# Patient Record
Sex: Female | Born: 1945 | Race: White | Hispanic: Yes | Marital: Married | Smoking: Never smoker
Health system: Southern US, Community
[De-identification: ages and names within clinical notes are randomized; demographics above are authoritative.]

## PROBLEM LIST (undated history)

## (undated) DIAGNOSIS — R079 Chest pain, unspecified: Secondary | ICD-10-CM

## (undated) DIAGNOSIS — R42 Dizziness and giddiness: Secondary | ICD-10-CM

## (undated) DIAGNOSIS — E2839 Other primary ovarian failure: Secondary | ICD-10-CM

## (undated) DIAGNOSIS — E119 Type 2 diabetes mellitus without complications: Secondary | ICD-10-CM

## (undated) DIAGNOSIS — I4891 Unspecified atrial fibrillation: Secondary | ICD-10-CM

## (undated) DIAGNOSIS — I10 Essential (primary) hypertension: Secondary | ICD-10-CM

## (undated) DIAGNOSIS — E785 Hyperlipidemia, unspecified: Secondary | ICD-10-CM

## (undated) DIAGNOSIS — I728 Aneurysm of other specified arteries: Secondary | ICD-10-CM

## (undated) HISTORY — DX: Unspecified atrial fibrillation: I48.91

## (undated) HISTORY — DX: Chest pain, unspecified: R07.9

## (undated) HISTORY — DX: Hyperlipidemia, unspecified: E78.5

## (undated) HISTORY — DX: Dizziness and giddiness: R42

## (undated) HISTORY — DX: Essential (primary) hypertension: I10

## (undated) HISTORY — DX: Other primary ovarian failure: E28.39

## (undated) HISTORY — DX: Aneurysm of other specified arteries: I72.8

## (undated) HISTORY — DX: Type 2 diabetes mellitus without complications: E11.9

---

## 1998-07-24 ENCOUNTER — Emergency Department (HOSPITAL_COMMUNITY): Admission: EM | Admit: 1998-07-24 | Discharge: 1998-07-24 | Payer: Self-pay | Admitting: Emergency Medicine

## 1998-07-24 ENCOUNTER — Encounter: Payer: Self-pay | Admitting: Emergency Medicine

## 1998-08-08 ENCOUNTER — Emergency Department (HOSPITAL_COMMUNITY): Admission: EM | Admit: 1998-08-08 | Discharge: 1998-08-08 | Payer: Self-pay | Admitting: *Deleted

## 1998-08-11 ENCOUNTER — Emergency Department (HOSPITAL_COMMUNITY): Admission: EM | Admit: 1998-08-11 | Discharge: 1998-08-11 | Payer: Self-pay | Admitting: Emergency Medicine

## 1998-08-11 ENCOUNTER — Encounter: Payer: Self-pay | Admitting: Emergency Medicine

## 1998-08-23 ENCOUNTER — Ambulatory Visit (HOSPITAL_COMMUNITY): Admission: RE | Admit: 1998-08-23 | Discharge: 1998-08-23 | Payer: Self-pay | Admitting: *Deleted

## 1999-02-13 ENCOUNTER — Emergency Department (HOSPITAL_COMMUNITY): Admission: EM | Admit: 1999-02-13 | Discharge: 1999-02-13 | Payer: Self-pay | Admitting: *Deleted

## 1999-09-26 ENCOUNTER — Emergency Department (HOSPITAL_COMMUNITY): Admission: EM | Admit: 1999-09-26 | Discharge: 1999-09-26 | Payer: Self-pay | Admitting: Emergency Medicine

## 1999-12-22 ENCOUNTER — Emergency Department (HOSPITAL_COMMUNITY): Admission: EM | Admit: 1999-12-22 | Discharge: 1999-12-22 | Payer: Self-pay

## 1999-12-22 ENCOUNTER — Encounter: Payer: Self-pay | Admitting: Emergency Medicine

## 2000-04-28 ENCOUNTER — Emergency Department (HOSPITAL_COMMUNITY): Admission: EM | Admit: 2000-04-28 | Discharge: 2000-04-28 | Payer: Self-pay | Admitting: Emergency Medicine

## 2000-07-13 ENCOUNTER — Ambulatory Visit (HOSPITAL_COMMUNITY): Admission: RE | Admit: 2000-07-13 | Discharge: 2000-07-13 | Payer: Self-pay | Admitting: Family Medicine

## 2000-07-15 ENCOUNTER — Encounter: Admission: RE | Admit: 2000-07-15 | Discharge: 2000-07-15 | Payer: Self-pay | Admitting: *Deleted

## 2002-07-21 ENCOUNTER — Encounter: Payer: Self-pay | Admitting: Internal Medicine

## 2002-07-21 ENCOUNTER — Ambulatory Visit (HOSPITAL_COMMUNITY): Admission: RE | Admit: 2002-07-21 | Discharge: 2002-07-21 | Payer: Self-pay | Admitting: Internal Medicine

## 2003-03-02 ENCOUNTER — Ambulatory Visit (HOSPITAL_COMMUNITY): Admission: RE | Admit: 2003-03-02 | Discharge: 2003-03-02 | Payer: Self-pay | Admitting: Internal Medicine

## 2003-07-04 ENCOUNTER — Emergency Department (HOSPITAL_COMMUNITY): Admission: EM | Admit: 2003-07-04 | Discharge: 2003-07-04 | Payer: Self-pay | Admitting: Emergency Medicine

## 2004-06-25 ENCOUNTER — Emergency Department (HOSPITAL_COMMUNITY): Admission: EM | Admit: 2004-06-25 | Discharge: 2004-06-25 | Payer: Self-pay | Admitting: Family Medicine

## 2004-10-13 ENCOUNTER — Emergency Department (HOSPITAL_COMMUNITY): Admission: EM | Admit: 2004-10-13 | Discharge: 2004-10-13 | Payer: Self-pay | Admitting: Emergency Medicine

## 2004-10-20 ENCOUNTER — Ambulatory Visit (HOSPITAL_COMMUNITY): Admission: RE | Admit: 2004-10-20 | Discharge: 2004-10-20 | Payer: Self-pay | Admitting: Family Medicine

## 2005-02-16 ENCOUNTER — Encounter: Admission: RE | Admit: 2005-02-16 | Discharge: 2005-02-16 | Payer: Self-pay | Admitting: Family Medicine

## 2005-06-16 ENCOUNTER — Encounter: Payer: Self-pay | Admitting: Obstetrics and Gynecology

## 2005-06-16 ENCOUNTER — Ambulatory Visit: Payer: Self-pay | Admitting: Obstetrics and Gynecology

## 2006-01-11 ENCOUNTER — Ambulatory Visit: Payer: Self-pay | Admitting: Family Medicine

## 2006-03-04 ENCOUNTER — Ambulatory Visit: Payer: Self-pay | Admitting: Gastroenterology

## 2006-07-07 ENCOUNTER — Ambulatory Visit: Payer: Self-pay | Admitting: Family Medicine

## 2006-07-15 ENCOUNTER — Ambulatory Visit: Payer: Self-pay | Admitting: Family Medicine

## 2006-07-15 LAB — CONVERTED CEMR LAB
ALT: 13 units/L (ref 0–40)
AST: 19 units/L (ref 0–37)
BUN: 15 mg/dL (ref 6–23)
CO2: 31 meq/L (ref 19–32)
Calcium: 9.1 mg/dL (ref 8.4–10.5)
Chloride: 104 meq/L (ref 96–112)
Cholesterol: 192 mg/dL (ref 0–200)
Creatinine, Ser: 0.7 mg/dL (ref 0.4–1.2)
GFR calc Af Amer: 109 mL/min
GFR calc non Af Amer: 90 mL/min
Glucose, Bld: 95 mg/dL (ref 70–99)
HDL: 58.7 mg/dL (ref >39.0)
LDL Cholesterol: 115 mg/dL — ABNORMAL HIGH (ref 0–99)
Potassium: 4.2 meq/L (ref 3.5–5.1)
Sodium: 140 meq/L (ref 135–145)
Total CHOL/HDL Ratio: 3.3
Triglycerides: 90 mg/dL (ref 0–149)
VLDL: 18 mg/dL (ref 0–40)

## 2006-08-12 ENCOUNTER — Ambulatory Visit: Payer: Self-pay | Admitting: Family Medicine

## 2006-09-22 ENCOUNTER — Telehealth (INDEPENDENT_AMBULATORY_CARE_PROVIDER_SITE_OTHER): Payer: Self-pay | Admitting: *Deleted

## 2006-09-22 DIAGNOSIS — E785 Hyperlipidemia, unspecified: Secondary | ICD-10-CM | POA: Insufficient documentation

## 2006-09-22 DIAGNOSIS — I10 Essential (primary) hypertension: Secondary | ICD-10-CM | POA: Insufficient documentation

## 2006-11-11 ENCOUNTER — Ambulatory Visit: Payer: Self-pay | Admitting: Family Medicine

## 2006-11-11 DIAGNOSIS — F329 Major depressive disorder, single episode, unspecified: Secondary | ICD-10-CM | POA: Insufficient documentation

## 2006-11-11 LAB — CONVERTED CEMR LAB
ALT: 15 units/L (ref 0–35)
AST: 19 units/L (ref 0–37)
BUN: 17 mg/dL (ref 6–23)
CO2: 28 meq/L (ref 19–32)
Calcium: 8.7 mg/dL (ref 8.4–10.5)
Chloride: 105 meq/L (ref 96–112)
Cholesterol: 207 mg/dL (ref 0–200)
Creatinine, Ser: 0.8 mg/dL (ref 0.4–1.2)
Direct LDL: 118.9 mg/dL
GFR calc Af Amer: 93 mL/min
GFR calc non Af Amer: 77 mL/min
Glucose, Bld: 92 mg/dL (ref 70–99)
HDL: 51.9 mg/dL (ref >39.0)
Potassium: 3.8 meq/L (ref 3.5–5.1)
Sodium: 141 meq/L (ref 135–145)
Total CHOL/HDL Ratio: 4
Triglycerides: 98 mg/dL (ref 0–149)
VLDL: 20 mg/dL (ref 0–40)

## 2006-11-12 ENCOUNTER — Telehealth (INDEPENDENT_AMBULATORY_CARE_PROVIDER_SITE_OTHER): Payer: Self-pay | Admitting: *Deleted

## 2007-07-13 ENCOUNTER — Telehealth (INDEPENDENT_AMBULATORY_CARE_PROVIDER_SITE_OTHER): Payer: Self-pay | Admitting: *Deleted

## 2007-08-04 ENCOUNTER — Ambulatory Visit: Payer: Self-pay | Admitting: Internal Medicine

## 2007-08-04 ENCOUNTER — Telehealth (INDEPENDENT_AMBULATORY_CARE_PROVIDER_SITE_OTHER): Payer: Self-pay | Admitting: *Deleted

## 2007-08-04 DIAGNOSIS — H9319 Tinnitus, unspecified ear: Secondary | ICD-10-CM | POA: Insufficient documentation

## 2007-08-04 DIAGNOSIS — J309 Allergic rhinitis, unspecified: Secondary | ICD-10-CM | POA: Insufficient documentation

## 2007-08-19 ENCOUNTER — Ambulatory Visit: Payer: Self-pay | Admitting: Internal Medicine

## 2007-08-19 DIAGNOSIS — B351 Tinea unguium: Secondary | ICD-10-CM

## 2007-11-25 ENCOUNTER — Other Ambulatory Visit: Admission: RE | Admit: 2007-11-25 | Discharge: 2007-11-25 | Payer: Self-pay | Admitting: Family Medicine

## 2007-11-25 ENCOUNTER — Ambulatory Visit: Payer: Self-pay | Admitting: Internal Medicine

## 2007-11-25 ENCOUNTER — Encounter: Payer: Self-pay | Admitting: Internal Medicine

## 2007-11-28 ENCOUNTER — Encounter (INDEPENDENT_AMBULATORY_CARE_PROVIDER_SITE_OTHER): Payer: Self-pay | Admitting: *Deleted

## 2007-11-28 LAB — CONVERTED CEMR LAB: Pap Smear: NORMAL

## 2007-12-01 ENCOUNTER — Ambulatory Visit: Payer: Self-pay | Admitting: Internal Medicine

## 2007-12-01 LAB — CONVERTED CEMR LAB
OCCULT 1: NEGATIVE
OCCULT 2: NEGATIVE
OCCULT 3: NEGATIVE

## 2008-02-15 ENCOUNTER — Telehealth (INDEPENDENT_AMBULATORY_CARE_PROVIDER_SITE_OTHER): Payer: Self-pay | Admitting: *Deleted

## 2008-03-09 ENCOUNTER — Telehealth (INDEPENDENT_AMBULATORY_CARE_PROVIDER_SITE_OTHER): Payer: Self-pay | Admitting: *Deleted

## 2009-01-14 ENCOUNTER — Encounter (INDEPENDENT_AMBULATORY_CARE_PROVIDER_SITE_OTHER): Payer: Self-pay | Admitting: *Deleted

## 2010-05-12 ENCOUNTER — Encounter: Payer: Self-pay | Admitting: Internal Medicine

## 2010-05-18 LAB — CONVERTED CEMR LAB
ALT: 11 units/L (ref 0–35)
AST: 18 units/L (ref 0–37)
Albumin: 4.4 g/dL (ref 3.5–5.2)
Alkaline Phosphatase: 90 units/L (ref 39–117)
BUN: 20 mg/dL (ref 6–23)
Basophils Absolute: 0.1 10*3/uL (ref 0.0–0.1)
Basophils Relative: 1 % (ref 0–1)
Bilirubin, Direct: <0.1 mg/dL (ref 0.0–0.3)
CO2: 23 meq/L (ref 19–32)
Calcium: 8.8 mg/dL (ref 8.4–10.5)
Chloride: 106 meq/L (ref 96–112)
Cholesterol: 243 mg/dL — ABNORMAL HIGH (ref 0–200)
Creatinine, Ser: 0.78 mg/dL (ref 0.40–1.20)
Eosinophils Absolute: 0.1 10*3/uL (ref 0.0–0.7)
Eosinophils Relative: 2 % (ref 0–5)
Glucose, Bld: 95 mg/dL (ref 70–99)
HCT: 44.2 % (ref 36.0–46.0)
HDL: 57 mg/dL (ref >39)
Hemoglobin: 14.3 g/dL (ref 12.0–15.0)
LDL Cholesterol: 140 mg/dL — ABNORMAL HIGH (ref 0–99)
Lymphocytes Relative: 44 % (ref 12–46)
Lymphs Abs: 2.4 10*3/uL (ref 0.7–4.0)
MCHC: 32.4 g/dL (ref 30.0–36.0)
MCV: 95.7 fL (ref 78.0–100.0)
Monocytes Absolute: 0.3 10*3/uL (ref 0.1–1.0)
Monocytes Relative: 6 % (ref 3–12)
Neutro Abs: 2.6 10*3/uL (ref 1.7–7.7)
Neutrophils Relative %: 47 % (ref 43–77)
Platelets: 285 10*3/uL (ref 150–400)
Potassium: 4.4 meq/L (ref 3.5–5.3)
RBC: 4.62 M/uL (ref 3.87–5.11)
RDW: 14.6 % (ref 11.5–15.5)
Sodium: 142 meq/L (ref 135–145)
TSH: 2.108 microintl units/mL (ref 0.350–4.50)
Total Bilirubin: 0.5 mg/dL (ref 0.3–1.2)
Total CHOL/HDL Ratio: 4.3
Total Protein: 7.4 g/dL (ref 6.0–8.3)
Triglycerides: 229 mg/dL — ABNORMAL HIGH (ref <150)
VLDL: 46 mg/dL — ABNORMAL HIGH (ref 0–40)
WBC: 5.5 10*3/uL (ref 4.0–10.5)

## 2010-09-05 NOTE — Assessment & Plan Note (Signed)
[facility]                        [place] OFFICE NOTE   [name], [name]              MRN:          [id number]  DATE:07/07/2006                            DOB:          04/03/1945    REASON FOR VISIT:  Depression.   [name] is a 65 year old female with a history of  hypertension, hyperlipidemia, who presents today, stating that, over the  last three to four months, she has been having trouble with depressed  mood, irritability and insomnia.  She states that she is also having  crying spells without cause.  She does report a similar episode,  postpartum with her last child.  She did not seek medical attention at  that point, but was able to get out of it on her own.  She denies any  homicidal or suicidal ideations.   [name] also has a history of hypertension.  She reports  that she has been taking her lisinopril daily, has not had any side  effects.  She also has a history of hyperlipidemia and is currently on  Lovastatin 40 mg daily.   Review of medical records shows that I referred her for colonoscopy, but  patient declined the colonoscopy after being seen, stating that it was  not being covered by her insurance company, but will reschedule in the  near future.  Additionally, she did report that she went to see the  general surgeon, who advised her that the nodule that was noted on exam  was unremarkable and did not require surgical therapy, per patient.   MEDICATIONS:  1. Lovastatin 40 mg daily.  2. Lisinopril 20 mg daily.   ALLERGIES:  ALLEGRA causes anxiety.   REVIEW OF SYSTEMS:  As per HPI, otherwise unremarkable.   OBJECTIVE:  Weight 151.6, pulse 70, respiratory rate of 16, blood  pressure 130/76.  GENERALLY:  We have a pleasant female, whose mood appears down.  Affect  is flat.  NECK:  Supple, no lymphadenopathy, carotid bruits or JVD.  No  thyromegaly was noted.  LUNGS:  Clear.  HEART:  Regular rate and rhythm, no murmurs, gallops or rubs.  EXTREMITIES:  No cyanosis, clubbing or edema.  PSYCHIATRIC:  The patient's mood, again, appeared down with a flat  affect.  Speech was regular rate and rhythm.  She was well-dressed and  groomed, alert and oriented.   IMPRESSION:  1. Major depressive episode.  2. Hypertension.  3. Hyperlipidemia.   PLAN:  1. In regards to her depression, supportive counseling was provided.      I recommended therapy, but patient, at this point, states she is      too busy.  I also advised her we could start her on a medication      and we agreed on Celexa 20 mg daily.  She is to start a half tablet      for one week and then increase to a full tablet.  Reviewed positive      side effects.  She is advised, if she has any suicidal thoughts or      ideation, she is to seek urgent attention and stop the medication.  Otherwise, she is to follow up with me in one month.  2. In regards to her hypertension, she is to continue lisinopril 20 mg      daily.  She will schedule a fasting BMET next week, as she is      nonfasting today.  3. In regards to her hyperlipidemia, she will continue her Lovastatin      40 mg daily.  We will check a lipid profile, AST and ALT.  4. Further recommendations after review of the laboratory data.  If      there are any changes, patient will be advised of that.     Leanne Chang, M.D.  Electronically Signed    LA/MedQ  DD: 07/07/2006  DT: 07/07/2006  Job #: [id number]

## 2010-09-05 NOTE — Assessment & Plan Note (Signed)
[facility]                          [place] OFFICE NOTE   MELONEY, FELD              MRN:          [id number]  DATE:01/11/2006                            DOB:          04/03/1945    REASON FOR VISIT:  Followup medications.   [name] is a 65 year old female with a history of  hyperlipidemia and hypertension presents today for followup.  She reports  she has been taking her medications regularly with no side effects.   Patient also complains of right mid back pain.  She states that she noticed  a nodule in the area which is uncomfortable to palpation and intermittently  causes a sharp pain.  Denies any trauma.   She also complains of left hip pain.  She states that it only bothers her  when she lays on the left side while she sleeps.  Her job is pretty physical  but reports that the pain is not severe during the day.  She denies any  trauma.  Symptoms have been present for several months to a year, but has  become more prominent in the last couple of months.  It does not inhibit her  gait.   PAST MEDICAL HISTORY:  1. Hyperlipidemia.  2. Hypertension.  3. Gastroesophageal reflux disease.   SURGICAL HISTORY:  Jaw surgery after an accident.   MEDICATION:  1. Lovastatin 40 mg q. day.  2. Lisinopril 20 mg q. day.  3. Nexium 40 mg q. day, but patient is off the medicine secondary to      expense, used to get samples.   ALLERGIES:  ALLEGRA causes anxiety.   FAMILY HISTORY:  Father passed away secondary to alcohol abuse.  Mother died  secondary to complications from a fall.  She has 3 living siblings and 1  with hyperlipidemia.   SOCIAL HISTORY:  She currently works at [employer].  Married with 2  children.  Denies any alcohol or tobacco use.   REVIEW OF SYSTEMS:  As per HPI, otherwise unremarkable.   OBJECTIVE:  Weight 147.4, pulse 80, blood pressure 130/72.  GENERAL:  We have a pleasant female in  no acute distress, questioned  appropriately.  HEENT:  Unremarkable.  NECK:  Was supple.  No lymphadenopathy, carotid bruits, JVD or thyromegaly  noted.  LUNGS:  Clear.  HEART:  With regular rate and rhythm.  Normal S1, S2.  No murmurs, gallops  or rubs.  EXTREMITIES:  No cyanosis, clubbing or edema.  Examination of the left hip is significant for full range of motion, very  minimal tenderness with palpation of the left greater trochanter.  No  discomfort with full range of motion.  Examination of the back significant for a 1-1/2 to 2 cm cystic easily  movable nodule, mildly tender to palpation, soft in consistency.   IMPRESSION:  1. Hypertension.  2. Hyperlipidemia.  3. Gastroesophageal reflux disease.  4. Symptomatic cystic nodule right mid back.  5. Left hip pain.   IMPRESSION:  1. We will obtain lipid, AST, ALT panel.  We will also check a BMET.  2. I did provide samples of Prevacid 30 mg q. day.  3. In regards to her hip, we will obtain an x-ray to assess for any      obvious pathology.  4. Will refer patient to a general surgeon to consider excision of the      nodule that causes discomfort.  5. Review of health maintenance issues reveal that patient has not had a      colonoscopy and she agrees to have one scheduled.  6. Patient will follow up with me in 3 months or sooner if needed.                                   Leanne Chang, M.D.   LA/MedQ  DD:  01/11/2006  DT:  01/13/2006  Job #:  [id number]

## 2010-09-05 NOTE — Assessment & Plan Note (Signed)
[facility]                        [place] OFFICE NOTE   Linda Ferguson, Linda Ferguson              MRN:          [id number]  DATE:08/12/2006                            DOB:          04/03/1945    REASON FOR VISIT:  Follow up depression.  Linda Ferguson presents today  reporting that she feels much better compared to her last visit.  She  reports that she is less stressed.  She is also out walking and  exercising more regularly.  As a matter of fact, she has lost about 7  pounds.  She does report that the Celexa has been helping her a lot.  She has not had any side effects.  Additionally, she has been taking her  other medications regularly and has not had any side effects from her  Lisinopril or Lovastatin.   MEDICATIONS:  1. Lovastatin 40 mg daily.  2. Lisinopril 20 mg daily.  3. Celexa 20 mg daily.   ALLERGIES:  ALLEGRA.   OBJECTIVE:  Weight 145.6, temperature 98.5, pulse 64, blood pressure  130/80.  GENERAL:  A pleasant female who is smiling, in no acute distress,  answers questions appropriately.  Mood within normal limits.  Affect  appropriate.  Well dressed and groomed.   IMPRESSION:  1. Depression, improved.  2. Hypertension, stable.  3. Hyperlipidemia, stable.   PLAN:  1. Supportive counseling was provided.  Advised the patient to      continue Celexa with the goal of at least 9-12 months.  2. The patient will follow up with me in 2-3 months or sooner if she      notices regression of her symptoms.  The patient expresses      understanding.     Leanne Chang, M.D.  Electronically Signed    LA/MedQ  DD: 08/12/2006  DT: 08/12/2006  Job #: [id number]

## 2017-01-12 ENCOUNTER — Ambulatory Visit
Admission: RE | Admit: 2017-01-12 | Discharge: 2017-01-12 | Disposition: A | Discharge status: Home / Self Care | Payer: Self-pay | Source: Ambulatory Visit | Attending: Nurse Practitioner | Admitting: Nurse Practitioner

## 2017-01-12 ENCOUNTER — Other Ambulatory Visit: Payer: Self-pay | Admitting: Nurse Practitioner

## 2017-01-12 DIAGNOSIS — R52 Pain, unspecified: Secondary | ICD-10-CM

## 2017-01-13 ENCOUNTER — Ambulatory Visit
Admission: RE | Admit: 2017-01-13 | Discharge: 2017-01-13 | Disposition: A | Discharge status: Home / Self Care | Payer: No Typology Code available for payment source | Source: Ambulatory Visit | Attending: Nurse Practitioner | Admitting: Nurse Practitioner

## 2017-01-13 DIAGNOSIS — R52 Pain, unspecified: Secondary | ICD-10-CM

## 2017-01-13 MED ORDER — IOPAMIDOL (ISOVUE-300) INJECTION 61%
100.0000 mL | Freq: Once | INTRAVENOUS | Status: AC | PRN
  Administered 2017-01-13: 100 mL via INTRAVENOUS

## 2017-05-21 ENCOUNTER — Encounter: Payer: Self-pay | Admitting: Vascular Surgery

## 2017-05-21 ENCOUNTER — Ambulatory Visit (INDEPENDENT_AMBULATORY_CARE_PROVIDER_SITE_OTHER): Payer: Self-pay | Admitting: Vascular Surgery

## 2017-05-21 VITALS — BP 134/77 | HR 55 | Temp 97.2°F | Resp 18 | Ht 66.0 in | Wt 166.0 lb

## 2017-05-21 DIAGNOSIS — I728 Aneurysm of other specified arteries: Secondary | ICD-10-CM

## 2017-05-21 DIAGNOSIS — R1032 Left lower quadrant pain: Secondary | ICD-10-CM

## 2017-05-21 NOTE — Progress Notes (Signed)
HISTORY AND PHYSICAL     CC: splenic artery aneurysm Requesting Provider:  Wanda Plump, MD  HPI: This is a 72 y.o. female who was seen by her PCP for abdominal pain and weight loss.  Pt states that she had about a 6lb weight loss over a couple of months, but this resolved and she gained the weight back.  She states that she has occasional swelling in her abdomen.  Her pain hurts worse when she wears pants that are not elastic.   She denies having any pain with eating.  She states that she was told she has an ulcer and was placed on Protonix.  She does have acid reflux/heartburn as well.  She was scheduled to have a colonoscopy/EGD but those results are not available.  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    She states that she has a slow heart beat.  She c/o that her legs ache, but denies any claudication sx.  She states that the aching feeling wakes her at night.  She does wear compression socks sometimes and says this helps.  She takes a daily aspirin.  She is on an ARB for blood pressure control.  The pt is on a statin for cholesterol management.  She has never smoked.   Past Medical History:  Diagnosis Date  . Atrial fibrillation (HCC)   . Splenic artery aneurysm [facility])     History reviewed. No pertinent surgical history.  Not on File  Current Outpatient Medications  Medication Sig Dispense Refill  . aspirin EC 81 MG tablet Take 81 mg by mouth daily.    . cholecalciferol (VITAMIN D) 400 units TABS tablet Take 400 Units by mouth.    . losartan-hydrochlorothiazide (HYZAAR) 50-12.5 MG tablet Take 1 tablet by mouth daily.    Marland Kitchen lovastatin (MEVACOR) 40 MG tablet Take 40 mg by mouth at bedtime.    . pantoprazole (PROTONIX) 40 MG tablet Take 40 mg by mouth daily.     No current facility-administered medications for this visit.     History reviewed. No pertinent family  history.  Social History   Socioeconomic History  . Marital status: Single    Spouse name: Not on file  . Number of children: Not on file  . Years of education: Not on file  . Highest education level: Not on file  Social Needs  . Financial resource strain: Not on file  . Food insecurity - worry: Not on file  . Food insecurity - inability: Not on file  . Transportation needs - medical: Not on file  . Transportation needs - non-medical: Not on file  Occupational History  . Not on file  Tobacco Use  . Smoking status: Never Smoker  . Smokeless tobacco: Never Used  Substance and Sexual Activity  . Alcohol use: No    Frequency: Never  . Drug use: No  . Sexual activity: Not on file  Other Topics Concern  . Not on file  Social History Narrative  . Not on file     REVIEW OF SYSTEMS:   [X]  denotes positive finding, [ ]  denotes negative finding Cardiac  Comments:  Chest pain or chest pressure: x   Shortness of breath upon exertion: x   Short of breath when lying flat:    Irregular heart rhythm: x See HPI      Vascular    Pain in  calf, thigh, or hip brought on by ambulation: x See HPI  Pain in feet at night that wakes you up from your sleep:  x See HPI  Blood clot in your veins:    Leg swelling:         Pulmonary    Oxygen at home:    Productive cough:     Wheezing:         Neurologic    Sudden weakness in arms or legs:     Sudden numbness in arms or legs:     Sudden onset of difficulty speaking or slurred speech:    Temporary loss of vision in one eye:     Problems with dizziness:  x       Gastrointestinal    Blood in stool:     Vomited blood:         Genitourinary    Burning when urinating:     Blood in urine:        Psychiatric    Major depression:         Hematologic    Bleeding problems:    Problems with blood clotting too easily:        Skin    Rashes or ulcers:        Constitutional    Fever or chills: x Says she had the flu about 3 months  ago.    PHYSICAL EXAMINATION:  Vitals:   05/21/17 1255  BP: 134/77  Pulse: (!) 55  Resp: 18  Temp: (!) 97.2 F (36.2 C)  SpO2: 97%   Vitals:   05/21/17 1255  Weight: 166 lb (75.3 kg)  Height: 5\' 6"  (1.676 m)   Body mass index is 26.79 kg/m.  General:  WDWN in NAD; vital signs documented above Gait: Not observed HENT: WNL, normocephalic Pulmonary: normal non-labored breathing , without Rales, rhonchi,  wheezing Cardiac: regular HR, without  Murmurs without carotid bruits Abdomen: soft, NT, no masses; the spleen and liver are not palpable Skin: without rashes Vascular Exam/Pulses:  Right Left  Radial 2+ (normal) 2+ (normal)  Femoral 2+ (normal) 2+ (normal)  Popliteal Unable to palpate  Unable to palpate   DP 2+ (normal) 2+ (normal)  PT Unable to palpate  Unable to palpate    Extremities: without ischemic changes, without Gangrene , without cellulitis; without open wounds;  Musculoskeletal: no muscle wasting or atrophy  Neurologic: A&O X 3;  No focal weakness or paresthesias are detected Psychiatric:  The pt has Normal affect. Lymph : No Cervical, Axillary, or Inguinal lymphadenopathy    Non-Invasive Vascular Imaging:   CTA 01/13/17: IMPRESSION: 1. Round peripherally calcified structure appears to represent a heavily calcified aneurysm possibly originating from the origin of the splenic artery measuring 2.9 cm in maximum diameter. 2. No renal or ureteral calculi. 3. The appendix and terminal ileum are unremarkable. 4. Mild abdominal aortic atherosclerotic change. 5. A small area of low-attenuation in the right adnexa. Consider pelvic ultrasound if further assessment is warranted clinically.  Pt meds includes: Statin:  Yes.   Beta Blocker:  No. Aspirin:  Yes.   ACEI:  No. ARB:  Yes.   CCB use:  No Other Antiplatelet/Anticoagulant:  No   ASSESSMENT/PLAN:: 72 y.o. female with incidental finding of splenic artery aneurysm in setting of abdominal  pain.   -pt complains of abdominal pain and points to the left lower quadrant as to where it hurts.  Her pain is not being caused by the splenic artery aneurysm.  It does measure 2.9cm, but is partially thrombosed.  Would not recommend coiling in this pt as the risk is higher for the actual procedure than the aneurysm rupturing.  Will plan to repeat CTA in one year.  She will return sooner if her pain worsens.  If that is the case, she may need an arteriogram.  If she does need an intervention in the future, she will need the appropriate vaccinations prior to proceeding.    Doreatha Massed, PA-C [facility] [phone]  Clinic MD:  Pt seen and examined with Dr. Imogene Burn  Addendum  I have independently interviewed and examined the patient, and I agree with the physician assistant's findings.  Pt's sx are not consistent with her splenic artery aneurysm.  This patient is post-menopausal so the hormonal changes felt to be a complicating factor for SAA is not present in this patient.  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  The aneurysm is thrombosing already, so I don't feel necessarily embolizing the aneurysm is necessary.  Additionally, the geometry of the aneurysm makes me concerned that any attempt to embolize this aneurysm might result in inadvertent embolization to the other celiac artery branches.  Subsequently, I recommend getting a new CTA abd/pelvis in one year.    Leonides Sake, MD, FACS [facility] [place] Office: [phone] Pager: [phone]  05/21/2017, 3:10 PM

## 2018-05-25 ENCOUNTER — Other Ambulatory Visit: Payer: Self-pay

## 2018-05-25 DIAGNOSIS — I728 Aneurysm of other specified arteries: Secondary | ICD-10-CM

## 2018-07-06 ENCOUNTER — Other Ambulatory Visit: Payer: Self-pay

## 2018-07-12 ENCOUNTER — Ambulatory Visit: Payer: Self-pay | Admitting: Vascular Surgery

## 2018-07-25 ENCOUNTER — Encounter (INDEPENDENT_AMBULATORY_CARE_PROVIDER_SITE_OTHER): Payer: Self-pay

## 2019-03-21 ENCOUNTER — Ambulatory Visit: Payer: Self-pay | Admitting: Internal Medicine

## 2019-07-01 ENCOUNTER — Ambulatory Visit: Payer: Self-pay | Attending: Internal Medicine

## 2019-07-01 DIAGNOSIS — Z23 Encounter for immunization: Secondary | ICD-10-CM

## 2019-07-01 NOTE — Progress Notes (Signed)
   Covid-19 Vaccination Clinic  Name:  Linda Ferguson    MRN: [id number] DOB: 02-Dec-1945  07/01/2019  [name] was observed post Covid-19 immunization for 15 minutes without incident. She was provided with Vaccine Information Sheet and instruction to access the V-Safe system.   [name] was instructed to call 911 with any severe reactions post vaccine: Marland Kitchen Difficulty breathing  . Swelling of face and throat  . A fast heartbeat  . A bad rash all over body  . Dizziness and weakness   Immunizations Administered    Name Date Dose VIS Date Route   Pfizer COVID-19 Vaccine 07/01/2019  2:37 PM 0.3 mL 03/31/2019 Intramuscular   Manufacturer: [employer], [employer]   Lot: [id number]   NDC: [address]-4

## 2019-07-25 ENCOUNTER — Ambulatory Visit: Payer: Self-pay | Attending: Internal Medicine

## 2019-07-25 DIAGNOSIS — Z23 Encounter for immunization: Secondary | ICD-10-CM

## 2019-07-25 NOTE — Progress Notes (Signed)
   Covid-19 Vaccination Clinic  Name:  Ixchel Duck    MRN: [id number] DOB: 1945/12/28  07/25/2019  [name] was observed post Covid-19 immunization for 15 minutes without incident. She was provided with Vaccine Information Sheet and instruction to access the V-Safe system.   [name] was instructed to call 911 with any severe reactions post vaccine: Marland Kitchen Difficulty breathing  . Swelling of face and throat  . A fast heartbeat  . A bad rash all over body  . Dizziness and weakness   Immunizations Administered    Name Date Dose VIS Date Route   Pfizer COVID-19 Vaccine 07/25/2019  2:54 PM 0.3 mL 03/31/2019 Intramuscular   Manufacturer: [employer], [employer]   Lot: [id number]   NDC: [address]-0

## 2020-01-19 ENCOUNTER — Other Ambulatory Visit (HOSPITAL_BASED_OUTPATIENT_CLINIC_OR_DEPARTMENT_OTHER): Payer: Self-pay | Admitting: Internal Medicine

## 2020-01-19 MED FILL — FLUAD QUADRIVALENT 0.5 ML P: 0.5 | 1 days supply | Qty: 1 | Fill #0

## 2020-01-29 ENCOUNTER — Other Ambulatory Visit (HOSPITAL_BASED_OUTPATIENT_CLINIC_OR_DEPARTMENT_OTHER): Payer: Self-pay | Admitting: Internal Medicine

## 2020-01-29 ENCOUNTER — Ambulatory Visit: Payer: Self-pay | Attending: Internal Medicine

## 2020-01-29 DIAGNOSIS — Z23 Encounter for immunization: Secondary | ICD-10-CM

## 2020-01-29 MED FILL — PFIZER-BIONTECH COVID-19 VA: 30 | 17 days supply | Qty: 0 | Fill #0

## 2020-01-29 NOTE — Progress Notes (Signed)
° °  Covid-19 Vaccination Clinic  Name:  Carlyon Nolasco    MRN: [id number] DOB: 10/15/1945  01/29/2020  [name] was observed post Covid-19 immunization for 15 minutes without incident. She was provided with Vaccine Information Sheet and instruction to access the V-Safe system. Vaccinated by Theodis Sato.  [name] was instructed to call 911 with any severe reactions post vaccine:  Difficulty breathing   Swelling of face and throat   A fast heartbeat   A bad rash all over body   Dizziness and weakness

## 2021-08-12 ENCOUNTER — Ambulatory Visit (INDEPENDENT_AMBULATORY_CARE_PROVIDER_SITE_OTHER): Payer: 59

## 2021-08-12 ENCOUNTER — Ambulatory Visit (HOSPITAL_COMMUNITY)
Admission: EM | Admit: 2021-08-12 | Discharge: 2021-08-12 | Disposition: A | Discharge status: Home / Self Care | Payer: 59 | Attending: Family Medicine | Admitting: Family Medicine

## 2021-08-12 ENCOUNTER — Encounter (HOSPITAL_COMMUNITY): Payer: Self-pay | Admitting: Emergency Medicine

## 2021-08-12 DIAGNOSIS — U071 COVID-19: Secondary | ICD-10-CM | POA: Insufficient documentation

## 2021-08-12 DIAGNOSIS — R059 Cough, unspecified: Secondary | ICD-10-CM | POA: Diagnosis present

## 2021-08-12 DIAGNOSIS — R911 Solitary pulmonary nodule: Secondary | ICD-10-CM | POA: Insufficient documentation

## 2021-08-12 DIAGNOSIS — J069 Acute upper respiratory infection, unspecified: Secondary | ICD-10-CM | POA: Diagnosis not present

## 2021-08-12 MED ORDER — BENZONATATE 100 MG PO CAPS: 100.0000 mg | ORAL_CAPSULE | Freq: Three times a day (TID) | ORAL | 0 refills | Status: DC | PRN

## 2021-08-12 NOTE — ED Triage Notes (Signed)
Pt reports nasal congestion and productive cough x 5 days.  ?

## 2021-08-12 NOTE — Discharge Instructions (Addendum)
Your chest xray did not show pneumonia or fluid. There was a small nodule in the right lower side, and they recommend getting a CT done. ? ? ?You have been swabbed for COVID, and the test will result in the next 24 hours. Our staff will call you if positive. If the test is positive, you should quarantine for 5 days.  ? ?Benzonatate 100 mg--1 capsule 3 times daily as needed for cough  ?

## 2021-08-12 NOTE — ED Provider Notes (Signed)
?[facility] ? ? ? ?CSN: [id number] ?Arrival date & time: 08/12/21  1737 ? ? ?  ? ?History   ?Chief Complaint ?Chief Complaint  ?Patient presents with  ? Cough  ? Nasal Congestion  ? ? ?HPI ?Linda Ferguson is a 76 y.o. female.  ? ? ?Cough ?Here for a 5-day history of rhinorrhea and cough.  She had fever and chills the first day or 2 and that has resolved.  She has begun coughing more in the last 2 or 3 days.  She is bringing up white mucus.  No nausea or vomiting or diarrhea. ? ?Past medical history is significant for hypertension and high cholesterol ? ?Past Medical History:  ?Diagnosis Date  ? Atrial fibrillation (HCC)   ? Splenic artery aneurysm (HCC)   ? ? ?Patient Active Problem List  ? Diagnosis Date Noted  ? Splenic artery aneurysm (HCC) 05/21/2017  ? 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 TINNITUS, CHRONIC 08/04/2007  ? ALLERGIC RHINITIS 08/04/2007  ? DEPRESSION 11/11/2006  ? HYPERLIPIDEMIA 09/22/2006  ? HYPERTENSION 09/22/2006  ? ? ?History reviewed. No pertinent surgical history. ? ?OB History   ?No obstetric history on file. ?  ? ? ? ?Home Medications   ? ?Prior to Admission medications   ?Medication Sig Start Date End Date Taking? Authorizing Provider  ?benzonatate (TESSALON) 100 MG capsule Take 1 capsule (100 mg total) by mouth 3 (three) times daily as needed for cough. 08/12/21  Yes Zenia ResidesBanister, Oden Lindaman K, MD  ?aspirin EC 81 MG tablet Take 81 mg by mouth daily.    [provider]  ?cholecalciferol (VITAMIN D) 400 units TABS tablet Take 400 Units by mouth.    [provider]  ?losartan-hydrochlorothiazide (HYZAAR) 50-12.5 MG tablet Take 1 tablet by mouth daily.    [provider]  ?lovastatin (MEVACOR) 40 MG tablet Take 40 mg by mouth at bedtime.    [provider]  ?pantoprazole (PROTONIX) 40 MG tablet Take 40 mg by mouth daily.    [provider]  ? ? ?Family History ?History reviewed. No pertinent family history. ? ?Social History ?Social  History  ? ?Tobacco Use  ? Smoking status: Never  ? Smokeless tobacco: Never  ?Vaping Use  ? Vaping Use: Never used  ?Substance Use Topics  ? Alcohol use: No  ? Drug use: No  ? ? ? ?Allergies   ?Allegra-d allergy & congestion [fexofenadine-pseudoephed er] ? ? ?Review of Systems ?Review of Systems  ?Respiratory:  Positive for cough.   ? ? ?Physical Exam ?Triage Vital Signs ?ED Triage Vitals  ?Enc Vitals Group  ?   BP 08/12/21 1818 (!) 151/67  ?   Pulse Rate 08/12/21 1818 61  ?   Resp 08/12/21 1818 17  ?   Temp 08/12/21 1818 98.4 ?F (36.9 ?C)  ?   Temp Source 08/12/21 1818 Oral  ?   SpO2 08/12/21 1818 96 %  ?   Weight 08/12/21 1816 166 lb 0.1 oz (75.3 kg)  ?   Height 08/12/21 1816 5\' 6"  (1.676 m)  ?   Head Circumference --   ?   Peak Flow --   ?   Pain Score 08/12/21 1816 0  ?   Pain Loc --   ?   Pain Edu? --   ?   Excl. in GC? --   ? ?No data found. ? ?Updated Vital Signs ?BP (!) 151/67 (BP Location: Left Arm)   Pulse 61   Temp 98.4 ?F (36.9 ?C) (Oral)  Resp 17   Ht 5\' 6"  (1.676 m)   Wt 75.3 kg   SpO2 96%   BMI 26.79 kg/m?  ? ?Visual Acuity ?Right Eye Distance:   ?Left Eye Distance:   ?Bilateral Distance:   ? ?Right Eye Near:   ?Left Eye Near:    ?Bilateral Near:    ? ?Physical Exam ?Vitals reviewed.  ?Constitutional:   ?   General: She is not in acute distress. ?   Appearance: She is not toxic-appearing.  ?HENT:  ?   Right Ear: Tympanic membrane and ear canal normal.  ?   Left Ear: Tympanic membrane and ear canal normal.  ?   Nose: Nose normal.  ?   Mouth/Throat:  ?   Mouth: Mucous membranes are moist.  ?   Pharynx: No oropharyngeal exudate or posterior oropharyngeal erythema.  ?Eyes:  ?   Extraocular Movements: Extraocular movements intact.  ?   Conjunctiva/sclera: Conjunctivae normal.  ?   Pupils: Pupils are equal, round, and reactive to light.  ?Cardiovascular:  ?   Rate and Rhythm: Normal rate and regular rhythm.  ?   Heart sounds: No murmur heard. ?Pulmonary:  ?   Effort: Pulmonary effort is normal. No  respiratory distress.  ?   Breath sounds: No wheezing, rhonchi or rales.  ?Chest:  ?   Chest wall: No tenderness.  ?Musculoskeletal:  ?   Cervical back: Neck supple.  ?Lymphadenopathy:  ?   Cervical: No cervical adenopathy.  ?Skin: ?   Capillary Refill: Capillary refill takes less than 2 seconds.  ?   Coloration: Skin is not jaundiced or pale.  ?Neurological:  ?   General: No focal deficit present.  ?   Mental Status: She is alert and oriented to person, place, and time.  ?Psychiatric:     ?   Behavior: Behavior normal.  ? ? ? ?UC Treatments / Results  ?Labs ?(all labs ordered are listed, but only abnormal results are displayed) ?Labs Reviewed  ?SARS CORONAVIRUS 2 (TAT 6-24 HRS)  ? ? ?EKG ? ? ?Radiology ?DG Chest 2 View ? ?Result Date: 08/12/2021 ?CLINICAL DATA:  Cough for 5 days EXAM: CHEST - 2 VIEW COMPARISON:  CT chest 12/22/1999, CT 2018 FINDINGS: Normal cardiac silhouette. Lungs are mildly hyperinflated. No effusion, infiltrate or pneumothorax. 12 mm nodule projects over the RIGHT lower lobe. Calcified splenic artery aneurysm measuring 2.7 cm over the central mid abdomen unchanged from comparison CT 2018. IMPRESSION: 1. No acute cardiopulmonary findings. 2. Potential RIGHT lower lobe nodule. Recommend CT of the thorax without contrast for further characterization. Electronically Signed   By: 2019 M.D.   On: 08/12/2021 19:27   ? ?Procedures ?Procedures (including critical care time) ? ?Medications Ordered in UC ?Medications - No data to display ? ?Initial Impression / Assessment and Plan / UC Course  ?I have reviewed the triage vital signs and the nursing notes. ? ?Pertinent labs & imaging results that were available during my care of the patient were reviewed by me and considered in my medical decision making (see chart for details). ? ?  ? ?COVID swab done, since she is elderly. Assistance requested to help her find a PCP. ?Final Clinical Impressions(s) / UC Diagnoses  ? ?Final diagnoses:  ?Viral  URI with cough  ?Solitary pulmonary nodule  ? ? ? ?Discharge Instructions   ? ?  ?Your chest xray did not show pneumonia or fluid. There was a small nodule in the right lower side, and they recommend  getting a CT done. ? ? ?You have been swabbed for COVID, and the test will result in the next 24 hours. Our staff will call you if positive. If the test is positive, you should quarantine for 5 days.  ? ?Benzonatate 100 mg--1 capsule 3 times daily as needed for cough  ? ? ? ? ?ED Prescriptions   ? ? Medication Sig Dispense Auth. Provider  ? benzonatate (TESSALON) 100 MG capsule Take 1 capsule (100 mg total) by mouth 3 (three) times daily as needed for cough. 21 capsule Zenia Resides, MD  ? ?  ? ?PDMP not reviewed this encounter. ?  ?Zenia Resides, MD ?08/12/21 1941 ? ?

## 2021-08-13 LAB — SARS CORONAVIRUS 2 (TAT 6-24 HRS): SARS Coronavirus 2: POSITIVE — AB

## 2021-10-24 ENCOUNTER — Ambulatory Visit: Payer: Self-pay | Admitting: Nurse Practitioner

## 2021-10-29 ENCOUNTER — Encounter (HOSPITAL_COMMUNITY): Payer: Self-pay

## 2021-10-29 ENCOUNTER — Ambulatory Visit (HOSPITAL_COMMUNITY)
Admission: EM | Admit: 2021-10-29 | Discharge: 2021-10-29 | Disposition: A | Discharge status: Home / Self Care | Payer: 59 | Attending: Emergency Medicine | Admitting: Emergency Medicine

## 2021-10-29 DIAGNOSIS — B370 Candidal stomatitis: Secondary | ICD-10-CM

## 2021-10-29 DIAGNOSIS — Z760 Encounter for issue of repeat prescription: Secondary | ICD-10-CM | POA: Diagnosis not present

## 2021-10-29 MED ORDER — LOVASTATIN 40 MG PO TABS: 40.0000 mg | ORAL_TABLET | Freq: Every day | ORAL | 0 refills | Status: DC

## 2021-10-29 MED ORDER — LOSARTAN POTASSIUM-HCTZ 50-12.5 MG PO TABS: 1.0000 | ORAL_TABLET | Freq: Every day | ORAL | 1 refills | Status: DC

## 2021-10-29 MED ORDER — NYSTATIN 100000 UNIT/ML MT SUSP: 500000.0000 [IU] | Freq: Four times a day (QID) | OROMUCOSAL | 0 refills | Status: AC

## 2021-10-29 NOTE — ED Provider Notes (Signed)
[facility] Provider Note  Patient Contact: 7:33 PM (approximate)   History   Medication Refill   HPI  Linda Ferguson is a 76 y.o. female presents to the urgent care requesting prescription for thrush and also refills of her blood pressure and cholesterol medication.  She has no other medical complaints.      Physical Exam   Triage Vital Signs: ED Triage Vitals  Enc Vitals Group     BP 10/29/21 1845 (!) 162/72     Pulse Rate 10/29/21 1845 (!) 42     Resp 10/29/21 1845 16     Temp 10/29/21 1845 97.9 F (36.6 C)     Temp Source 10/29/21 1845 Oral     SpO2 10/29/21 1845 94 %     Weight 10/29/21 1848 150 lb (68 kg)     Height 10/29/21 1848 5' 4.96" (1.65 m)     Head Circumference --      Peak Flow --      Pain Score 10/29/21 1847 0     Pain Loc --      Pain Edu? --      Excl. in GC? --     Most recent vital signs: Vitals:   10/29/21 1845  BP: (!) 162/72  Pulse: (!) 42  Resp: 16  Temp: 97.9 F (36.6 C)  SpO2: 94%     General: Alert and in no acute distress. Eyes:  PERRL. EOMI. Head: No acute traumatic findings ENT:      Nose: No congestion/rhinnorhea.      Mouth/Throat: Mucous membranes are moist.  White plaques of oral mucosa visualized. Neck: No stridor. No cervical spine tenderness to palpation. Cardiovascular:  Good peripheral perfusion Respiratory: Normal respiratory effort without tachypnea or retractions. Lungs CTAB. Good air entry to the bases with no decreased or absent breath sounds. Gastrointestinal: Bowel sounds 4 quadrants. Soft and nontender to palpation. No guarding or rigidity. No palpable masses. No distention. No CVA tenderness. Musculoskeletal: Full range of motion to all extremities.  Neurologic:  No gross focal neurologic deficits are appreciated.  Skin:   No rash noted Other:   ED Results / Procedures / Treatments   Labs (all labs ordered are listed, but only abnormal results are  displayed) Labs Reviewed - No data to display      PROCEDURES:  Critical Care performed: No  Procedures   MEDICATIONS ORDERED IN ED: Medications - No data to display   IMPRESSION / MDM / ASSESSMENT AND PLAN / ED COURSE  I reviewed the triage vital signs and the nursing notes.                              Assessment and plan Thrush Medication Refill 76 year old female presents to the urgent care for medication refills of her blood pressure medicine and cholesterol medication.  Patient also has physical exam findings concerning for thrush.  Patient was given refills of her blood pressure and cholesterol medication as requested and was prescribed nystatin for thrush.     FINAL CLINICAL IMPRESSION(S) / ED DIAGNOSES   Final diagnoses:  Medication refill  Thrush     Rx / DC Orders   ED Discharge Orders          Ordered    losartan-hydrochlorothiazide (HYZAAR) 50-12.5 MG tablet  Daily        10/29/21 1909    lovastatin (MEVACOR) 40 MG tablet  Daily at  bedtime        10/29/21 1909    nystatin (MYCOSTATIN) 100000 UNIT/ML suspension  4 times daily        10/29/21 1919             Note:  This document was prepared using Dragon voice recognition software and may include unintentional dictation errors.   Pia Mau [place], [place] 10/29/21 1935

## 2021-10-29 NOTE — Discharge Instructions (Addendum)
Swish 5 mL of nystatin daily for 14 days.

## 2021-10-29 NOTE — ED Triage Notes (Signed)
Patient needing a refill of her cholesterol medication and blood pressure. Has been out of medication for a month.   Patient states her tongue is white starting today. Patient having reflux as well.

## 2021-11-04 ENCOUNTER — Ambulatory Visit: Payer: 59 | Attending: Family Medicine | Admitting: Family Medicine

## 2021-11-04 ENCOUNTER — Encounter: Payer: Self-pay | Admitting: Family Medicine

## 2021-11-04 VITALS — BP 169/71 | HR 50 | Temp 98.1°F | Ht 66.0 in | Wt 155.6 lb

## 2021-11-04 DIAGNOSIS — I10 Essential (primary) hypertension: Secondary | ICD-10-CM | POA: Diagnosis not present

## 2021-11-04 DIAGNOSIS — F331 Major depressive disorder, recurrent, moderate: Secondary | ICD-10-CM

## 2021-11-04 DIAGNOSIS — J3089 Other allergic rhinitis: Secondary | ICD-10-CM | POA: Diagnosis not present

## 2021-11-04 DIAGNOSIS — Z1159 Encounter for screening for other viral diseases: Secondary | ICD-10-CM

## 2021-11-04 DIAGNOSIS — E782 Mixed hyperlipidemia: Secondary | ICD-10-CM

## 2021-11-04 MED ORDER — LOSARTAN POTASSIUM-HCTZ 100-25 MG PO TABS: 1.0000 | ORAL_TABLET | Freq: Every day | ORAL | 1 refills | Status: DC

## 2021-11-04 MED ORDER — CETIRIZINE HCL 10 MG PO TABS: 10.0000 mg | ORAL_TABLET | Freq: Every day | ORAL | 1 refills | Status: AC

## 2021-11-04 MED ORDER — FLUTICASONE PROPIONATE 50 MCG/ACT NA SUSP: 2.0000 | Freq: Every day | NASAL | 6 refills | Status: DC

## 2021-11-04 MED ORDER — LOVASTATIN 40 MG PO TABS: 40.0000 mg | ORAL_TABLET | Freq: Every day | ORAL | 1 refills | Status: DC

## 2021-11-04 MED ORDER — ESCITALOPRAM OXALATE 10 MG PO TABS: 10.0000 mg | ORAL_TABLET | Freq: Every day | ORAL | 1 refills | Status: DC

## 2021-11-04 NOTE — Progress Notes (Signed)
Subjective:  Patient ID: Linda Ferguson, female    DOB: [birth year]  Age: 76 y.o. MRN: [id number]  CC: New Patient (Initial Visit)   HPI Linda Ferguson is a 76 y.o. year old female with a history of HTN, Hyperlipidemia, Depression here to establish care. Seen with the aid of video [employer] interpreter.  Interval History: A.fib appears under her PMH however she denies being aware of this.  Her BP is elevated and she informs me when she went to the ED for a refill she received a decreased dose of her antihypertensive and prior to that had been out of her medications x1 week.  Complains of dryness in her throat which started early this year and occurs at night. She endorses clogging of her ears, itchy ears  and nasal congestion Past Medical History:  Diagnosis Date   Atrial fibrillation [facility])    Splenic artery aneurysm ([place])     History reviewed. No pertinent surgical history.  History reviewed. No pertinent family history.  Social History   Socioeconomic History   Marital status: Single    Spouse name: Not on file   Number of children: Not on file   Years of education: Not on file   Highest education level: Not on file  Occupational History   Not on file  Tobacco Use   Smoking status: Never   Smokeless tobacco: Never  Vaping Use   Vaping Use: Never used  Substance and Sexual Activity   Alcohol use: No   Drug use: No   Sexual activity: Not on file  Other Topics Concern   Not on file  Social History Narrative   Not on file   Social Determinants of Health   Financial Resource Strain: Not on file  Food Insecurity: Not on file  Transportation Needs: Not on file  Physical Activity: Not on file  Stress: Not on file  Social Connections: Not on file    Allergies  Allergen Reactions   Allegra-D Allergy & Congestion [Fexofenadine-Pseudoephed Er] Palpitations    Outpatient Medications Prior to Visit  Medication Sig Dispense Refill   aspirin EC  81 MG tablet Take 81 mg by mouth daily.     cholecalciferol (VITAMIN D) 400 units TABS tablet Take 400 Units by mouth.     nystatin (MYCOSTATIN) 100000 UNIT/ML suspension Use as directed 5 mLs (500,000 Units total) in the mouth or throat 4 (four) times daily for 14 days. 60 mL 0   pantoprazole (PROTONIX) 40 MG tablet Take 40 mg by mouth daily.     losartan-hydrochlorothiazide (HYZAAR) 50-12.5 MG tablet Take 1 tablet by mouth daily. 30 tablet 1   lovastatin (MEVACOR) 40 MG tablet Take 1 tablet (40 mg total) by mouth at bedtime. 30 tablet 0   No facility-administered medications prior to visit.     ROS Review of Systems  Constitutional:  Negative for activity change and appetite change.  HENT:  Positive for congestion. Negative for sinus pressure and sore throat.   Respiratory:  Negative for chest tightness, shortness of breath and wheezing.   Cardiovascular:  Negative for chest pain and palpitations.  Gastrointestinal:  Negative for abdominal distention, abdominal pain and constipation.  Genitourinary: Negative.   Musculoskeletal: Negative.   Psychiatric/Behavioral:  Negative for behavioral problems and dysphoric mood.     Objective:  BP (!) 169/71   Pulse (!) 50   Temp 98.1 F (36.7 C) (Oral)   Ht 5' 6"  (1.676 m)   Wt 155 lb 9.6 oz (70.6 kg)  LMP  (LMP Unknown)   SpO2 99%   BMI 25.11 kg/m      11/04/2021    9:00 AM 10/29/2021    6:48 PM 10/29/2021    6:45 PM  BP/Weight  Systolic BP 161  096  Diastolic BP 71  72  Wt. (Lbs) 155.6 150   BMI 25.11 kg/m2 24.99 kg/m2       Physical Exam Constitutional:      Appearance: She is well-developed.  HENT:     Right Ear: Tympanic membrane normal.     Left Ear: Tympanic membrane normal.     Mouth/Throat:     Mouth: Mucous membranes are moist.  Cardiovascular:     Rate and Rhythm: Bradycardia present.     Heart sounds: Normal heart sounds. No murmur heard. Pulmonary:     Effort: Pulmonary effort is normal.     Breath  sounds: Normal breath sounds. No wheezing or rales.  Chest:     Chest wall: No tenderness.  Abdominal:     General: Bowel sounds are normal. There is no distension.     Palpations: Abdomen is soft. There is no mass.     Tenderness: There is no abdominal tenderness.  Musculoskeletal:        General: Normal range of motion.     Right lower leg: No edema.     Left lower leg: No edema.  Neurological:     Mental Status: She is alert and oriented to person, place, and time.  Psychiatric:        Mood and Affect: Mood normal.        Latest Ref Rng & Units 11/25/2007   12:00 AM 11/11/2006   11:44 AM 07/15/2006    8:35 AM  CMP  Glucose 70 - 99 mg/dL 95  92  95   BUN 6 - 23 mg/dL 20  17  15    Creatinine 0.45 - 1.20 mg/dL 0.78  0.8  0.7   Sodium 135 - 145 meq/L 142  141  140   Potassium 3.5 - 5.3 meq/L 4.4  3.8  4.2   Chloride 96 - 112 meq/L 106  105  104   CO2 19 - 32 meq/L 23  28  31    Calcium 8.4 - 10.5 mg/dL 8.8  8.7  9.1   Total Protein 6.0 - 8.3 g/dL 7.4     Total Bilirubin 0.3 - 1.2 mg/dL 0.5     Alkaline Phos 39 - 117 units/L 90     AST 0 - 37 units/L 18  19  19    ALT 0 - 35 units/L 11  15  13      Lipid Panel     Component Value Date/Time   CHOL 243 (H) 11/25/2007 0000   TRIG 229 (H) 11/25/2007 0000   HDL 57 11/25/2007 0000   CHOLHDL 4.3 Ratio 11/25/2007 0000   VLDL 46 (H) 11/25/2007 0000   LDLCALC 140 (H) 11/25/2007 0000   LDLDIRECT 118.9 11/11/2006 1144    CBC    Component Value Date/Time   WBC 5.5 11/25/2007 0000   RBC 4.62 11/25/2007 0000   HGB 14.3 11/25/2007 0000   HCT 44.2 11/25/2007 0000   PLT 285 11/25/2007 0000   MCV 95.7 11/25/2007 0000   MCHC 32.4 11/25/2007 0000   RDW 14.6 11/25/2007 0000   LYMPHSABS 2.4 11/25/2007 0000   MONOABS 0.3 11/25/2007 0000   EOSABS 0.1 11/25/2007 0000   BASOSABS 0.1 11/25/2007 0000    No  results found for: "HGBA1C"  Assessment & Plan:  1. Mixed hyperlipidemia We will check lipid panel. She had been out of  medication hence I will make no changes if labs are abnormal Low-cholesterol diet - lovastatin (MEVACOR) 40 MG tablet; Take 1 tablet (40 mg total) by mouth at bedtime.  Dispense: 90 tablet; Refill: 1 - LP+Non-HDL Cholesterol  2. Primary hypertension Uncontrolled Increase dose of losartan/HCTZ Counseled on blood pressure goal of less than 130/80, low-sodium, DASH diet, medication compliance, 150 minutes of moderate intensity exercise per week. Discussed medication compliance, adverse effects. - losartan-hydrochlorothiazide (HYZAAR) 100-25 MG tablet; Take 1 tablet by mouth daily.  Dispense: 90 tablet; Refill: 1 - CMP14+EGFR  3. Moderate episode of recurrent major depressive disorder (HCC) Stable - escitalopram (LEXAPRO) 10 MG tablet; Take 1 tablet (10 mg total) by mouth daily.  Dispense: 90 tablet; Refill: 1  4. Non-seasonal allergic rhinitis due to other allergic trigger Uncontrolled Could explain her symptoms  - CBC with Differential/Platelet - cetirizine (ZYRTEC) 10 MG tablet; Take 1 tablet (10 mg total) by mouth daily.  Dispense: 30 tablet; Refill: 1 - fluticasone (FLONASE) 50 MCG/ACT nasal spray; Place 2 sprays into both nostrils daily.  Dispense: 16 g; Refill: 6  5. Screening for viral disease - HCV Ab w Reflex to Quant PCR    Meds ordered this encounter  Medications   losartan-hydrochlorothiazide (HYZAAR) 100-25 MG tablet    Sig: Take 1 tablet by mouth daily.    Dispense:  90 tablet    Refill:  1   lovastatin (MEVACOR) 40 MG tablet    Sig: Take 1 tablet (40 mg total) by mouth at bedtime.    Dispense:  90 tablet    Refill:  1   cetirizine (ZYRTEC) 10 MG tablet    Sig: Take 1 tablet (10 mg total) by mouth daily.    Dispense:  30 tablet    Refill:  1   fluticasone (FLONASE) 50 MCG/ACT nasal spray    Sig: Place 2 sprays into both nostrils daily.    Dispense:  16 g    Refill:  6   escitalopram (LEXAPRO) 10 MG tablet    Sig: Take 1 tablet (10 mg total) by mouth daily.     Dispense:  90 tablet    Refill:  1    Follow-up: Return in about 6 weeks (around 12/16/2021) for Blood Pressure follow-up.       Charlott Rakes, MD, FAAFP. [facility] and [place] [place], [place]   11/04/2021, 10:37 AM

## 2021-11-04 NOTE — Patient Instructions (Signed)
Rinitis alrgica en adultos Allergic Rhinitis, Adult La rinitis alrgica es una reaccin a alrgenos. Los alrgenos son cosas que pueden causar una reaccin alrgica. Esta afeccin afecta el revestimiento del interior de la nariz (membrana mucosa). Existen dos tipos de rinitis alrgica: Estacional. Este tipo tambin se denomina fiebre del heno. Sucede nicamente durante algunas pocas del ao. Perenne. Este tipo puede ocurrir en cualquier momento del ao. Esta afeccin no puede transmitirse de una persona a otra (no es contagiosa). Puede ser leve, moderada o muy grave. Puede aparecer a cualquier edad y se puede superar con los aos. Cules son las causas? Esta afeccin puede ser causada por lo siguiente: El polen que proviene de los rboles, el pasto y las malezas. caros del polvo. Humo. Moho. Humo de vehculos. El pis (orina), la saliva o la caspa de mascotas. La caspa son las clulas muertas de la piel de una mascota. Qu incrementa el riesgo? Es ms probable que sufra esta afeccin si: Tiene alergias en su familia. Tiene problemas como alergias en su familia. Es posible que tenga: Hinchazn en parte de los ojos y los prpados. Asma. Esta afecta la respiracin. Enrojecimiento e hinchazn a largo plazo en la piel. Alergias a los alimentos. Cules son los signos o sntomas? El sntoma principal de esta afeccin es la secrecin nasal o el taponamiento nasal (congestin nasal). Otros sntomas pueden incluir: Tos o estornudos. Picazn y lagrimeo en los ojos. Mucosidad que gotea por la parte posterior de la garganta (goteo posnasal). Dificultad para dormir. Cansancio. Dolor de cabeza. Dolor de garganta. Cmo se trata? No hay cura para esta afeccin. Debe evitar las cosas a las cuales es alrgico. El tratamiento puede ayudar a aliviar los sntomas. Esto puede incluir: Medicamentos que inhiben los sntomas de la alergia, como los corticoesteroides o los antihistamnicos. Estos pueden  administrarse en forma de inyeccin, aerosol nasal o comprimidos. Evitar las cosas a las cuales es alrgico. Medicamentos que le dan pequeas cantidades de cosas a las que es alrgico a lo largo del tiempo. Esto se denomina inmunoterapia. Se realiza si otros tratamientos no son eficaces. Pueden darle lo siguiente: Inyecciones. Medicamentos debajo de la lengua. Medicamentos ms potentes, si otros tratamientos no son eficaces. Siga estas instrucciones en su casa: Evite los alrgenos Averige las cosas a las que es alrgico y evtelas. Para hacer esto, pruebe lo siguiente: Si tiene alergias en cualquier momento del ao: Reemplace las alfombras por pisos de madera, baldosas o vinilo. Las alfombras pueden retener la caspa de las mascotas y el polvo. No fume. No permita que fumen en su casa. Cambie los filtros de la calefaccin y del aire acondicionado al menos una vez al mes. Si tiene alergias solamente en algunos momentos del ao: Mantenga las ventanas cerradas cuando pueda. Planee hacer las cosas al aire libre cuando las concentraciones de polen estn muy bajas. Fjese en las concentraciones de polen antes de planificar cosas para hacer al aire libre. Cuando vuelva al interior, cmbiese de ropa y dchese antes de sentarse en los muebles o en la cama. Si es alrgico a una mascota: Mantenga a la mascota fuera de su dormitorio. Pase la aspiradora, barra y limpie el polvo con frecuencia.  Instrucciones generales Use los medicamentos de venta libre y los recetados solamente como se lo haya indicado el mdico. Beba suficiente lquido para mantener el pis (orina) de color amarillo plido. Concurra a todas las visitas de seguimiento como se lo haya indicado el mdico. Esto es importante. Dnde buscar ms informacin   American Academy of Allergy, Asthma & Immunology (Academia Estadounidense de Alergia, Asma e Inmunologa): www.aaaai.org Comunquese con un mdico si: Tiene fiebre. Tiene tos que no  desaparece. Comienza a emitir un sonido agudo al respirar (sibilancias). Los sntomas lo enlentecen. Los sntomas le impiden hacer las cosas habituales todos los das. Solicite ayuda de inmediato si: Le falta el aire. Este sntoma puede indicar una emergencia. No espere a ver si el sntoma desaparece. Solicite atencin mdica de inmediato. Comunquese con el servicio de emergencias de su localidad (911 en los [place]). No conduzca por sus propios medios hasta el hospital. Resumen La rinitis alrgica puede tratarse tomando medicamentos y evitando las cosas a las cuales es alrgico. Si tiene alergias solamente parte del ao, mantenga las ventanas cerradas cuando pueda en esos momentos. Comunquese con el mdico si tiene una fiebre o una tos que no desaparece. Esta informacin no tiene como fin reemplazar el consejo del mdico. Asegrese de hacerle al mdico cualquier pregunta que tenga. Document Revised: 06/01/2019 Document Reviewed: 06/01/2019 Elsevier Patient Education  2023 Elsevier Inc.  

## 2021-11-04 NOTE — Progress Notes (Signed)
States that throat is dry at night.

## 2021-11-05 LAB — CBC WITH DIFFERENTIAL/PLATELET
Basophils Absolute: 0.1 10*3/uL (ref 0.0–0.2)
Basos: 1 %
EOS (ABSOLUTE): 0.2 10*3/uL (ref 0.0–0.4)
Eos: 3 %
Hematocrit: 44.1 % (ref 34.0–46.6)
Hemoglobin: 14.6 g/dL (ref 11.1–15.9)
Immature Grans (Abs): 0 10*3/uL (ref 0.0–0.1)
Immature Granulocytes: 0 %
Lymphocytes Absolute: 2.4 10*3/uL (ref 0.7–3.1)
Lymphs: 40 %
MCH: 30.9 pg (ref 26.6–33.0)
MCHC: 33.1 g/dL (ref 31.5–35.7)
MCV: 93 fL (ref 79–97)
Monocytes Absolute: 0.4 10*3/uL (ref 0.1–0.9)
Monocytes: 6 %
Neutrophils Absolute: 2.9 10*3/uL (ref 1.4–7.0)
Neutrophils: 50 %
Platelets: 333 10*3/uL (ref 150–450)
RBC: 4.72 x10E6/uL (ref 3.77–5.28)
RDW: 14.1 % (ref 11.7–15.4)
WBC: 5.9 10*3/uL (ref 3.4–10.8)

## 2021-11-05 LAB — CMP14+EGFR
ALT: 23 IU/L (ref 0–32)
AST: 22 IU/L (ref 0–40)
Albumin/Globulin Ratio: 1.8 (ref 1.2–2.2)
Albumin: 4.8 g/dL (ref 3.8–4.8)
Alkaline Phosphatase: 100 IU/L (ref 44–121)
BUN/Creatinine Ratio: 19 (ref 12–28)
BUN: 20 mg/dL (ref 8–27)
Bilirubin Total: 0.8 mg/dL (ref 0.0–1.2)
CO2: 26 mmol/L (ref 20–29)
Calcium: 9.8 mg/dL (ref 8.7–10.3)
Chloride: 98 mmol/L (ref 96–106)
Creatinine, Ser: 1.05 mg/dL — ABNORMAL HIGH (ref 0.57–1.00)
Globulin, Total: 2.6 g/dL (ref 1.5–4.5)
Glucose: 110 mg/dL — ABNORMAL HIGH (ref 70–99)
Potassium: 4.4 mmol/L (ref 3.5–5.2)
Sodium: 142 mmol/L (ref 134–144)
Total Protein: 7.4 g/dL (ref 6.0–8.5)
eGFR: 55 mL/min/{1.73_m2} — ABNORMAL LOW (ref >59)

## 2021-11-05 LAB — LP+NON-HDL CHOLESTEROL
Cholesterol, Total: 253 mg/dL — ABNORMAL HIGH (ref 100–199)
HDL: 65 mg/dL (ref >39)
LDL Chol Calc (NIH): 151 mg/dL — ABNORMAL HIGH (ref 0–99)
Total Non-HDL-Chol (LDL+VLDL): 188 mg/dL — ABNORMAL HIGH (ref 0–129)
Triglycerides: 205 mg/dL — ABNORMAL HIGH (ref 0–149)
VLDL Cholesterol Cal: 37 mg/dL (ref 5–40)

## 2021-11-05 LAB — HCV INTERPRETATION

## 2021-11-05 LAB — HCV AB W REFLEX TO QUANT PCR: HCV Ab: NONREACTIVE

## 2021-11-10 ENCOUNTER — Other Ambulatory Visit: Payer: Self-pay | Admitting: Family Medicine

## 2021-11-10 NOTE — Telephone Encounter (Signed)
Medication Refill - Medication: pantoprazole (PROTONIX) 40 MG tablet   Has the patient contacted their pharmacy? Yes.   Pharmacy is calling.  Preferred Pharmacy (with phone number or street name):  [facility] - [place], [place] - [address] Phone:  [phone]  Fax:  [phone]     Has the patient been seen for an appointment in the last year OR does the patient have an upcoming appointment? Yes.

## 2021-11-11 ENCOUNTER — Ambulatory Visit: Payer: Self-pay

## 2021-11-11 MED ORDER — PANTOPRAZOLE SODIUM 40 MG PO TBEC: 40.0000 mg | DELAYED_RELEASE_TABLET | Freq: Every day | ORAL | 1 refills | Status: DC

## 2021-11-11 NOTE — Telephone Encounter (Signed)
Pt given lab results per notes of Dr. Alvis Lemmings on 11/05/21. Pt verbalized understanding. She says she doesn't have any Lovastatin becaue it was for a 30 day supply and she's out. Advised it was sent to [facility] on 11/04/21 #90/1 refill and to call them to ask for the refill, if not there call us back.     Summary: medication prescription clarity   Pt was expecting a new medication after her lab results / pts lab note says no change in regimen / please advise       Hoy Register, MD  11/05/2021  8:28 AM EDT Back to Top    Her kidney and liver functions are normal but cholesterol  is elevated likely because she had been out of her medications. I will make no regimen change today but encourage compliance with her medication and low cholesterol diet as well as exercise.   Reason for Disposition  Nursing judgment  Protocols used: No Guideline or Reference Available-A-AH

## 2021-11-11 NOTE — Telephone Encounter (Signed)
Requested medication (s) are due for refill today - unsure  Requested medication (s) are on the active medication list -yes  Future visit scheduled -yes  Last refill: unsure  Notes to clinic: medication listed as historical provider  Requested Prescriptions  Pending Prescriptions Disp Refills   pantoprazole (PROTONIX) 40 MG tablet      Sig: Take 1 tablet (40 mg total) by mouth daily.     Gastroenterology: Proton Pump Inhibitors Passed - 11/10/2021 11:39 AM      Passed - Valid encounter within last 12 months    Recent Outpatient Visits           1 week ago Mixed hyperlipidemia   [facility] Hoy Register, MD       Future Appointments             In 1 month Hoy Register, MD [facility]               Requested Prescriptions  Pending Prescriptions Disp Refills   pantoprazole (PROTONIX) 40 MG tablet      Sig: Take 1 tablet (40 mg total) by mouth daily.     Gastroenterology: Proton Pump Inhibitors Passed - 11/10/2021 11:39 AM      Passed - Valid encounter within last 12 months    Recent Outpatient Visits           1 week ago Mixed hyperlipidemia   [facility] Hoy Register, MD       Future Appointments             In 1 month Hoy Register, MD [facility]

## 2021-11-11 NOTE — Telephone Encounter (Signed)
Noted  

## 2021-12-16 ENCOUNTER — Encounter: Payer: Self-pay | Admitting: Family Medicine

## 2021-12-16 ENCOUNTER — Ambulatory Visit: Payer: Commercial Managed Care - HMO | Attending: Family Medicine | Admitting: Family Medicine

## 2021-12-16 VITALS — BP 168/73 | HR 68

## 2021-12-16 DIAGNOSIS — I10 Essential (primary) hypertension: Secondary | ICD-10-CM | POA: Diagnosis not present

## 2021-12-16 DIAGNOSIS — E782 Mixed hyperlipidemia: Secondary | ICD-10-CM | POA: Diagnosis not present

## 2021-12-16 DIAGNOSIS — E2839 Other primary ovarian failure: Secondary | ICD-10-CM | POA: Diagnosis not present

## 2021-12-16 DIAGNOSIS — Z23 Encounter for immunization: Secondary | ICD-10-CM

## 2021-12-16 DIAGNOSIS — G4709 Other insomnia: Secondary | ICD-10-CM

## 2021-12-16 DIAGNOSIS — K219 Gastro-esophageal reflux disease without esophagitis: Secondary | ICD-10-CM

## 2021-12-16 MED ORDER — LOVASTATIN 40 MG PO TABS: 40.0000 mg | ORAL_TABLET | Freq: Every day | ORAL | 1 refills | Status: AC

## 2021-12-16 MED ORDER — HYDROXYZINE HCL 25 MG PO TABS: 25.0000 mg | ORAL_TABLET | Freq: Every evening | ORAL | 1 refills | Status: DC | PRN

## 2021-12-16 MED ORDER — PANTOPRAZOLE SODIUM 40 MG PO TBEC: 40.0000 mg | DELAYED_RELEASE_TABLET | Freq: Every day | ORAL | 1 refills | Status: DC

## 2021-12-16 NOTE — Progress Notes (Signed)
Subjective:  Patient ID: Linda Ferguson, female    DOB: 1945-06-10  Age: 76 y.o. MRN: [id number]  CC: Hypertension   HPI Linda Ferguson is a 76 y.o. year old female with a history of HTN, Hyperlipidemia, Depression here for an office visit.  Interval History: At her last visit her blood pressure was uncontrolled and losartan/HCT dose was increased but her BP is still elevated today. BP at home runs in the 140-160 systolic.  She states she took her antihypertensive in the exam room while waiting but also goes on to say she does not take her losartan/HCTZ every day.  She Complains of insomnia which started when the Pandemic started she goes to bed 8-9 pm, denies taking daytime naps, denies   She has a foreign body sensation in her throat.  She has been out of her PPI Past Medical History:  Diagnosis Date   Atrial fibrillation [facility])    Splenic artery aneurysm (HCC)     History reviewed. No pertinent surgical history.  History reviewed. No pertinent family history.  Social History   Socioeconomic History   Marital status: Single    Spouse name: Not on file   Number of children: Not on file   Years of education: Not on file   Highest education level: Not on file  Occupational History   Not on file  Tobacco Use   Smoking status: Never   Smokeless tobacco: Never  Vaping Use   Vaping Use: Never used  Substance and Sexual Activity   Alcohol use: No   Drug use: No   Sexual activity: Not on file  Other Topics Concern   Not on file  Social History Narrative   Not on file   Social Determinants of Health   Financial Resource Strain: Not on file  Food Insecurity: Not on file  Transportation Needs: Not on file  Physical Activity: Not on file  Stress: Not on file  Social Connections: Not on file    Allergies  Allergen Reactions   Allegra-D Allergy & Congestion [Fexofenadine-Pseudoephed Er] Palpitations    Outpatient Medications Prior to Visit   Medication Sig Dispense Refill   aspirin EC 81 MG tablet Take 81 mg by mouth daily.     cetirizine (ZYRTEC) 10 MG tablet Take 1 tablet (10 mg total) by mouth daily. 30 tablet 1   escitalopram (LEXAPRO) 10 MG tablet Take 1 tablet (10 mg total) by mouth daily. 90 tablet 1   losartan-hydrochlorothiazide (HYZAAR) 100-25 MG tablet Take 1 tablet by mouth daily. 90 tablet 1   lovastatin (MEVACOR) 40 MG tablet Take 1 tablet (40 mg total) by mouth at bedtime. 90 tablet 1   cholecalciferol (VITAMIN D) 400 units TABS tablet Take 400 Units by mouth. (Patient not taking: Reported on 12/16/2021)     fluticasone (FLONASE) 50 MCG/ACT nasal spray Place 2 sprays into both nostrils daily. (Patient not taking: Reported on 12/16/2021) 16 g 6   pantoprazole (PROTONIX) 40 MG tablet Take 1 tablet (40 mg total) by mouth daily. (Patient not taking: Reported on 12/16/2021) 90 tablet 1   No facility-administered medications prior to visit.     ROS Review of Systems  Constitutional:  Negative for activity change and appetite change.  HENT:  Negative for sinus pressure and sore throat.   Respiratory:  Negative for chest tightness, shortness of breath and wheezing.   Cardiovascular:  Negative for chest pain and palpitations.  Gastrointestinal:  Negative for abdominal distention, abdominal pain and constipation.  Genitourinary:  Negative.   Musculoskeletal: Negative.   Psychiatric/Behavioral:  Negative for behavioral problems and dysphoric mood.     Objective:  BP (!) 168/73   Pulse 68   LMP  (LMP Unknown)      12/16/2021   10:23 AM 11/04/2021    9:00 AM 10/29/2021    6:48 PM  BP/Weight  Systolic BP 168 169   Diastolic BP 73 71   Wt. (Lbs)  155.6 150  BMI  25.11 kg/m2 24.99 kg/m2      Physical Exam Constitutional:      Appearance: She is well-developed.  Cardiovascular:     Rate and Rhythm: Normal rate.     Heart sounds: Normal heart sounds. No murmur heard. Pulmonary:     Effort: Pulmonary effort is  normal.     Breath sounds: Normal breath sounds. No wheezing or rales.  Chest:     Chest wall: No tenderness.  Abdominal:     General: Bowel sounds are normal. There is no distension.     Palpations: Abdomen is soft. There is no mass.     Tenderness: There is no abdominal tenderness.  Musculoskeletal:        General: Normal range of motion.     Right lower leg: No edema.     Left lower leg: No edema.  Neurological:     Mental Status: She is alert and oriented to person, place, and time.  Psychiatric:        Mood and Affect: Mood normal.        Latest Ref Rng & Units 11/04/2021    9:42 AM 11/25/2007   12:00 AM 11/11/2006   11:44 AM  CMP  Glucose 70 - 99 mg/dL 505  95  92   BUN 8 - 27 mg/dL 20  20  17    Creatinine 0.57 - 1.00 mg/dL  3.97  0.8   Sodium 134 - 144 mmol/L 142  142  141   Potassium 3.5 - 5.2 mmol/L 4.4  4.4  3.8   Chloride 96 - 106 mmol/L 98  106  105   CO2 20 - 29 mmol/L 26  23  28    Calcium 8.7 - 10.3 mg/dL 9.8  8.8  8.7   Total Protein 6.0 - 8.5 g/dL 7.4  7.4    Total Bilirubin 0.0 - 1.2 mg/dL 0.8  0.5    Alkaline Phos 44 - 121 IU/L 100  90    AST 0 - 40 IU/L 22  18  19    ALT 0 - 32 IU/L 23  11  15      Lipid Panel     Component Value Date/Time   CHOL 253 (H) 11/04/2021 0942   TRIG 205 (H) 11/04/2021 0942   HDL 65 11/04/2021 0942   CHOLHDL 4.3 Ratio 11/25/2007 0000   VLDL 46 (H) 11/25/2007 0000   LDLCALC 151 (H) 11/04/2021 0942   LDLDIRECT 118.9 11/11/2006 1144    CBC    Component Value Date/Time   WBC 5.9 11/04/2021 0942   WBC 5.5 11/25/2007 0000   RBC 4.72 11/04/2021 0942   RBC 4.62 11/25/2007 0000   HGB 14.6 11/04/2021 0942   HCT 44.1 11/04/2021 0942   PLT 333 11/04/2021 0942   MCV 93 11/04/2021 0942   MCH 30.9 11/04/2021 0942   MCHC 33.1 11/04/2021 0942   MCHC 32.4 11/25/2007 0000   RDW 14.1 11/04/2021 0942   LYMPHSABS 2.4 11/04/2021 0942   MONOABS 0.3 11/25/2007 0000  EOSABS 0.2 11/04/2021 0942   BASOSABS 0.1 11/04/2021 0942     No results found for: "HGBA1C"  Assessment & Plan:  1. Primary hypertension Uncontrolled due to the fact that she took her antihypertensive a few minutes ago Advised to take antihypertensive daily and keep a BP log which will be reviewed at next visit If blood pressure remains above goal at next visit we will initiate low-dose calcium channel blocker  2. Estrogen deficiency - DG Bone Density; Future  3. Mixed hyperlipidemia Uncontrolled as she had not been taking her statin prior to last visit We will plan to recheck in the next 3 months - lovastatin (MEVACOR) 40 MG tablet; Take 1 tablet (40 mg total) by mouth at bedtime.  Dispense: 90 tablet; Refill: 1  4. Other insomnia Counseled on sleep hygiene, moderate intensity exercise in the late afternoon or evening Trial of hydroxyzine - hydrOXYzine (ATARAX) 25 MG tablet; Take 1 tablet (25 mg total) by mouth at bedtime as needed.  Dispense: 30 tablet; Refill: 1  5. Gastroesophageal reflux disease without esophagitis Uncontrolled with foreign body sensation in her throat Restart PPI and if symptoms persist she needs to notify me so I can refer her to GI for upper endoscopy - pantoprazole (PROTONIX) 40 MG tablet; Take 1 tablet (40 mg total) by mouth daily.  Dispense: 90 tablet; Refill: 1  6. Need for pneumococcal 20-valent conjugate vaccination - Pneumococcal conjugate vaccine 20-valent  7. Flu vaccine need - Flu Vaccine QUAD 65mo+IM (Fluarix, Fluzone & Alfiuria Quad PF)    Meds ordered this encounter  Medications   pantoprazole (PROTONIX) 40 MG tablet    Sig: Take 1 tablet (40 mg total) by mouth daily.    Dispense:  90 tablet    Refill:  1   lovastatin (MEVACOR) 40 MG tablet    Sig: Take 1 tablet (40 mg total) by mouth at bedtime.    Dispense:  90 tablet    Refill:  1   hydrOXYzine (ATARAX) 25 MG tablet    Sig: Take 1 tablet (25 mg total) by mouth at bedtime as needed.    Dispense:  30 tablet    Refill:  1     Follow-up: Return in about 1 month (around 01/16/2022) for Blood Pressure follow-up.       Hoy Register, MD, FAAFP. [facility] [place], [place] [phone]   12/16/2021, 1:19 PM

## 2021-12-16 NOTE — Patient Instructions (Signed)

## 2021-12-16 NOTE — Progress Notes (Signed)
No concerns. 

## 2022-01-13 ENCOUNTER — Other Ambulatory Visit: Payer: Self-pay | Admitting: Family Medicine

## 2022-01-13 DIAGNOSIS — G4709 Other insomnia: Secondary | ICD-10-CM

## 2022-01-13 NOTE — Telephone Encounter (Signed)
Refilled 12/16/2021 330 1 rf. Requested Prescriptions  Pending Prescriptions Disp Refills  . hydrOXYzine (ATARAX) 25 MG tablet [Pharmacy Med Name: HYDROXYZINE HCL 25 MG ORAL TABLET] 30 tablet 1    Sig: TAKE 1 TABLET (25 MG TOTAL) BY MOUTH AT BEDTIME AS NEEDED.     Ear, Nose, and Throat:  Antihistamines 2 Failed - 01/13/2022  9:46 AM      Failed - Cr in normal range and within 360 days    Creatinine, Ser  Date Value Ref Range Status  11/04/2021 1.05 (H) 0.57 - 1.00 mg/dL Final         Passed - Valid encounter within last 12 months    Recent Outpatient Visits          4 weeks ago Primary hypertension   [place], Charlane Ferretti, MD   2 months ago Mixed hyperlipidemia   [place], Enobong, MD      Future Appointments            In 1 week Daisy Blossom, Jarome Matin, [place]

## 2022-01-21 NOTE — Progress Notes (Signed)
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 female who presents for hypertension evaluation, education, and management.  PMH is significant for HTN, GERD, MDD, and HLD.   Patient was referred and last seen by Primary Care Provider, Dr. Alvis Lemmings, on 8/29. At last visit, her BP was uncontrolled with a reading of 168/73. She stated that she runs 140-160s systolic at home. The patient reported to have taken her BP medication in the waiting room prior to the appointment, however, she also reported to not take her BP medication every day.    Today, patient arrives in good spirits and presents without assistance. She arrives with her husband. Denies dizziness, headache, blurred vision, swelling. She reported with various concerns outside of hypertension management. She expressed some concern with back pain extending down both legs leading to insomnia. Additionally, she mentioned joint pain. She reported increased urination and frequently leading to nocturia. She endorsed some numbness on the feet.  Family/Social history:  -Fhx: no pertinent FH -Alcohol: denies Tobacco: denies  Medication adherence appropriate . Patient has taken BP medications today. She reported  taking an additional half tablet of losartan/hctz in the afternoon if her blood pressure is elevated about once a week. She stated that a blood pressure elevation for her was anything greater than 150 systolic.   She appears to have some general confusion with her disease state and general management, possibly attributed to a language barrier.  Current antihypertensives include: losartan/HCTZ 100/25mg  once daily   Reported home BP readings: more consistently in the 130-140s  Patient reported dietary habits:  -eats chicken   Patient-reported exercise habits: works for a [employer]   O:  Vitals:   01/22/22 0940  BP: (!) 172/73  Pulse: (!) 49    Last 3 Office BP readings: BP Readings from Last 3  Encounters:  12/16/21 (!) 168/73  11/04/21 (!) 169/71  10/29/21 (!) 162/72    BMET    Component Value Date/Time   NA 142 11/04/2021 0942   K 4.4 11/04/2021 0942   CL 98 11/04/2021 0942   CO2 26 11/04/2021 0942   GLUCOSE 110 (H) 11/04/2021 0942   GLUCOSE 95 11/25/2007 0000   BUN 20 11/04/2021 0942   CREATININE 1.05 (H) 11/04/2021 0942   CALCIUM 9.8 11/04/2021 0942   GFRNONAA 77 11/11/2006 1144   GFRAA 93 11/11/2006 1144    Renal function: CrCl cannot be calculated (Patient's most recent lab result is older than the maximum 21 days allowed.).  Clinical ASCVD: No  The 10-year ASCVD risk score (Arnett DK, et al., 2019) is: 37.4%   Values used to calculate the score:     Age: 63 years     Sex: Female     Is Non-Hispanic African American: No     Diabetic: No     Tobacco smoker: No     Systolic Blood Pressure: 168 mmHg     Is BP treated: Yes     HDL Cholesterol: 65 mg/dL     Total Cholesterol: 253 mg/dL  A/P: Hypertension diagnosed currently uncontrolled on current medications. BP goal < 130/80 mmHg. Medication adherence appears appropriate. Control is suboptimal due to dietary indiscretions and some general confusion with the diagnosis.  -Started amlodipine 5mg  once daily -Continued lisinopril/HCTZ 100/25mg  once daily. Emphasized that she should only take ONE tablet daily, even if she get an elevated blood pressure reading.  -Patient educated on purpose, proper use, and potential adverse effects of amlodipine.  -  F/u labs ordered - CMP, A1c -Counseled on lifestyle modifications for blood pressure control including reduced dietary sodium, increased exercise, adequate sleep. -Encouraged patient to check BP at home and bring log of readings to next visit. Counseled on proper use of home BP cuff.    Results reviewed and written information provided.    Written patient instructions provided. Patient verbalized understanding of treatment plan.  Total time in face to face  counseling 30 minutes.    Follow-up:  PCP in November  Maryan Puls, PharmD PGY-1 [facility] Resident

## 2022-01-22 ENCOUNTER — Ambulatory Visit: Payer: Commercial Managed Care - HMO | Attending: Family Medicine | Admitting: Pharmacist

## 2022-01-22 VITALS — BP 172/73 | HR 49

## 2022-01-22 DIAGNOSIS — I10 Essential (primary) hypertension: Secondary | ICD-10-CM

## 2022-01-22 DIAGNOSIS — Z131 Encounter for screening for diabetes mellitus: Secondary | ICD-10-CM | POA: Diagnosis not present

## 2022-01-22 MED ORDER — AMLODIPINE BESYLATE 5 MG PO TABS
5.0000 mg | ORAL_TABLET | Freq: Every day | ORAL | 1 refills | Status: DC
Start: 2022-01-22 — End: 2022-06-03

## 2022-01-23 ENCOUNTER — Encounter: Payer: Self-pay | Admitting: Family Medicine

## 2022-01-23 ENCOUNTER — Other Ambulatory Visit: Payer: Self-pay | Admitting: Family Medicine

## 2022-01-23 DIAGNOSIS — E1169 Type 2 diabetes mellitus with other specified complication: Secondary | ICD-10-CM | POA: Insufficient documentation

## 2022-01-23 LAB — CMP14+EGFR
ALT: 13 IU/L (ref 0–32)
AST: 22 IU/L (ref 0–40)
Albumin/Globulin Ratio: 1.7 (ref 1.2–2.2)
Albumin: 4.5 g/dL (ref 3.8–4.8)
Alkaline Phosphatase: 101 IU/L (ref 44–121)
BUN/Creatinine Ratio: 23 (ref 12–28)
BUN: 22 mg/dL (ref 8–27)
Bilirubin Total: 0.7 mg/dL (ref 0.0–1.2)
CO2: 26 mmol/L (ref 20–29)
Calcium: 9.3 mg/dL (ref 8.7–10.3)
Chloride: 98 mmol/L (ref 96–106)
Creatinine, Ser: 0.94 mg/dL (ref 0.57–1.00)
Globulin, Total: 2.6 g/dL (ref 1.5–4.5)
Glucose: 100 mg/dL — ABNORMAL HIGH (ref 70–99)
Potassium: 4.4 mmol/L (ref 3.5–5.2)
Sodium: 142 mmol/L (ref 134–144)
Total Protein: 7.1 g/dL (ref 6.0–8.5)
eGFR: 63 mL/min/{1.73_m2} (ref >59)

## 2022-01-23 LAB — HEMOGLOBIN A1C
Est. average glucose Bld gHb Est-mCnc: 140 mg/dL
Hgb A1c MFr Bld: 6.5 % — ABNORMAL HIGH (ref 4.8–5.6)

## 2022-01-23 MED ORDER — METFORMIN HCL 500 MG PO TABS: 500.0000 mg | ORAL_TABLET | Freq: Every day | ORAL | 3 refills | Status: DC

## 2022-02-10 ENCOUNTER — Other Ambulatory Visit: Payer: Commercial Managed Care - HMO

## 2022-02-10 ENCOUNTER — Other Ambulatory Visit: Payer: Self-pay | Admitting: Family Medicine

## 2022-02-10 ENCOUNTER — Ambulatory Visit
Admission: RE | Admit: 2022-02-10 | Discharge: 2022-02-10 | Disposition: A | Discharge status: Home / Self Care | Payer: Commercial Managed Care - HMO | Source: Ambulatory Visit | Attending: Family Medicine | Admitting: Family Medicine

## 2022-02-10 DIAGNOSIS — G4709 Other insomnia: Secondary | ICD-10-CM

## 2022-02-10 DIAGNOSIS — E2839 Other primary ovarian failure: Secondary | ICD-10-CM

## 2022-03-09 ENCOUNTER — Encounter: Payer: Self-pay | Admitting: Family Medicine

## 2022-03-09 ENCOUNTER — Ambulatory Visit: Payer: Commercial Managed Care - HMO | Attending: Family Medicine | Admitting: Family Medicine

## 2022-03-09 VITALS — BP 125/72 | HR 86 | Ht 65.0 in | Wt 158.6 lb

## 2022-03-09 DIAGNOSIS — F331 Major depressive disorder, recurrent, moderate: Secondary | ICD-10-CM | POA: Diagnosis not present

## 2022-03-09 DIAGNOSIS — I10 Essential (primary) hypertension: Secondary | ICD-10-CM | POA: Diagnosis not present

## 2022-03-09 DIAGNOSIS — E1169 Type 2 diabetes mellitus with other specified complication: Secondary | ICD-10-CM

## 2022-03-09 MED ORDER — LOSARTAN POTASSIUM-HCTZ 100-25 MG PO TABS: 1.0000 | ORAL_TABLET | Freq: Every day | ORAL | 1 refills | Status: AC

## 2022-03-09 MED ORDER — ONETOUCH DELICA LANCETS 33G MISC: 1.0000 | Freq: Every day | 11 refills | Status: AC

## 2022-03-09 MED ORDER — METFORMIN HCL 500 MG PO TABS: 500.0000 mg | ORAL_TABLET | Freq: Every day | ORAL | 1 refills | Status: AC

## 2022-03-09 MED ORDER — ONETOUCH VERIO VI STRP: ORAL_STRIP | 12 refills | Status: AC

## 2022-03-09 MED ORDER — ESCITALOPRAM OXALATE 10 MG PO TABS: 10.0000 mg | ORAL_TABLET | Freq: Every day | ORAL | 1 refills | Status: AC

## 2022-03-09 MED ORDER — ONETOUCH VERIO W/DEVICE KIT: 1.0000 | PACK | Freq: Every day | 0 refills | Status: AC

## 2022-03-09 NOTE — Patient Instructions (Signed)
Diabetes mellitus y nutricin, en adultos Diabetes Mellitus and Nutrition, Adult Si sufre de diabetes, o diabetes mellitus, es muy importante tener hbitos alimenticios saludables debido a que sus niveles de azcar en la sangre (glucosa) se ven afectados en gran medida por lo que come y bebe. Comer alimentos saludables en las cantidades correctas, aproximadamente a la misma hora todos los das, lo ayudar a: Controlar su glucemia. Disminuir el riesgo de sufrir una enfermedad cardaca. Mejorar la presin arterial. Alcanzar o mantener un peso saludable. Qu puede afectar mi plan de alimentacin? Todas las personas que sufren de diabetes son diferentes y cada una tiene necesidades diferentes en cuanto a un plan de alimentacin. El mdico puede recomendarle que trabaje con un nutricionista para elaborar el mejor plan para usted. Su plan de alimentacin puede variar segn factores como: Las caloras que necesita. Los medicamentos que toma. Su peso. Sus niveles de glucemia, presin arterial y colesterol. Su nivel de actividad. Otras afecciones que tenga, como enfermedades cardacas o renales. Cmo me afectan los carbohidratos? Los carbohidratos, o hidratos de carbono, afectan su nivel de glucemia ms que cualquier otro tipo de alimento. La ingesta de carbohidratos aumenta la cantidad de glucosa en la sangre. Es importante conocer la cantidad de carbohidratos que se pueden ingerir en cada comida sin correr ningn riesgo. Esto es diferente en cada persona. Su nutricionista puede ayudarlo a calcular la cantidad de carbohidratos que debe ingerir en cada comida y en cada refrigerio. Cmo me afecta el alcohol? El alcohol puede provocar una disminucin de la glucemia (hipoglucemia), especialmente si usa insulina o toma determinados medicamentos por va oral para la diabetes. La hipoglucemia es una afeccin potencialmente mortal. Los sntomas de la hipoglucemia, como somnolencia, mareos y confusin, son  similares a los sntomas de haber consumido demasiado alcohol. No beba alcohol si: Su mdico le indica no hacerlo. Est embarazada, puede estar embarazada o est tratando de quedar embarazada. Si bebe alcohol: Limite la cantidad que bebe a lo siguiente: De 0 a 1 medida por da para las mujeres. De 0 a 2 medidas por da para los hombres. Sepa cunta cantidad de alcohol hay en las bebidas que toma. En los [place], una medida equivale a una botella de cerveza de 12 oz (355 ml), un vaso de vino de 5 oz (148 ml) o un vaso de una bebida alcohlica de alta graduacin de 1 oz (44 ml). Mantngase hidratado bebiendo agua, refrescos dietticos o t helado sin azcar. Tenga en cuenta que los refrescos comunes, los jugos y otras bebidas para mezclar pueden contener mucha azcar y se deben contar como carbohidratos. Consejos para seguir este plan  Leer las etiquetas de los alimentos Comience por leer el tamao de la porcin en la etiqueta de Informacin nutricional de los alimentos envasados y las bebidas. La cantidad de caloras, carbohidratos, grasas y otros nutrientes detallados en la etiqueta se basan en una porcin del alimento. Muchos alimentos contienen ms de una porcin por envase. Verifique la cantidad total de gramos (g) de carbohidratos totales en una porcin. Verifique la cantidad de gramos de grasas saturadas y grasas trans en una porcin. Escoja alimentos que no contengan estas grasas o que su contenido de estas sea bajo. Verifique la cantidad de miligramos (mg) de sal (sodio) en una porcin. La mayora de las personas deben limitar la ingesta de sodio total a menos de 2300 mg por da. Siempre consulte la informacin nutricional de los alimentos etiquetados como "con bajo contenido de grasa" o "sin grasa".   Estos alimentos pueden tener un mayor contenido de azcar agregada o carbohidratos refinados, y deben evitarse. Hable con su nutricionista para identificar sus objetivos diarios en  cuanto a los nutrientes mencionados en la etiqueta. Al ir de compras Evite comprar alimentos procesados, enlatados o precocidos. Estos alimentos tienden a tener una mayor cantidad de grasa, sodio y azcar agregada. Compre en la zona exterior de la tienda de comestibles. Esta es la zona donde se encuentran con mayor frecuencia las frutas y las verduras frescas, los cereales a granel, las carnes frescas y los productos lcteos frescos. Al cocinar Use mtodos de coccin a baja temperatura, como hornear, en lugar de mtodos de coccin a alta temperatura, como frer en abundante aceite. Cocine con aceites saludables, como el aceite de oliva, canola o girasol. Evite cocinar con manteca, crema o carnes con alto contenido de grasa. Planificacin de las comidas Coma las comidas y los refrigerios regularmente, preferentemente a la misma hora todos los das. Evite pasar largos perodos de tiempo sin comer. Consuma alimentos ricos en fibra, como frutas frescas, verduras, frijoles y cereales integrales. Consuma entre 4 y 6 onzas (entre 112 y 168 g) de protenas magras por da, como carnes magras, pollo, pescado, huevos o tofu. Una onza (oz) (28 g) de protena magra equivale a: 1 onza (28 g) de carne, pollo o pescado. 1 huevo.  taza (62 g) de tofu. Coma algunos alimentos por da que contengan grasas saludables, como aguacates, frutos secos, semillas y pescado. Qu alimentos debo comer? Frutas Bayas. Manzanas. Naranjas. Duraznos. Damascos. Ciruelas. Uvas. Mangos. Papayas. Granadas. Kiwi. Cerezas. Verduras Verduras de hoja verde, que incluyen lechuga, espinaca, col rizada, acelga, hojas de berza, hojas de mostaza y repollo. Remolachas. Coliflor. Brcoli. Zanahorias. Judas verdes. Tomates. Pimientos. Cebollas. Pepinos. Coles de Bruselas. Granos Granos integrales, como panes, galletas, tortillas, cereales y pastas de salvado o integrales. Avena sin azcar. Quinua. Arroz integral o salvaje. Carnes y otras  protenas Frutos de mar. Carne de ave sin piel. Cortes magros de ave y carne de res. Tofu. Frutos secos. Semillas. Lcteos Productos lcteos sin grasa o con bajo contenido de grasa, como leche, yogur y queso. Es posible que los productos detallados arriba no constituyan una lista completa de los alimentos y las bebidas que puede tomar. Consulte a un nutricionista para obtener ms informacin. Qu alimentos debo evitar? Frutas Frutas enlatadas al almbar. Verduras Verduras enlatadas. Verduras congeladas con mantequilla o salsa de crema. Granos Productos elaborados con harina y harina blanca refinada, como panes, pastas, bocadillos y cereales. Evite todos los alimentos procesados. Carnes y otras protenas Cortes de carne con alto contenido de grasa. Carne de ave con piel. Carnes empanizadas o fritas. Carne procesada. Evite las grasas saturadas. Lcteos Yogur, queso o leche enteros. Bebidas Bebidas azucaradas, como gaseosas o t helado. Es posible que los productos que se enumeran ms arriba no constituyan una lista completa de los alimentos y las bebidas que debe evitar. Consulte a un nutricionista para obtener ms informacin. Preguntas para hacerle al mdico Debo consultar con un especialista certificado en atencin y educacin sobre la diabetes? Es necesario que me rena con un nutricionista? A qu nmero puedo llamar si tengo preguntas? Cules son los mejores momentos para controlar la glucemia? Dnde encontrar ms informacin: American Diabetes Association (Asociacin Estadounidense de la Diabetes): diabetes.org Academy of Nutrition and Dietetics (Academia de Nutricin y Diettica): eatright.org National Institute of Diabetes and Digestive and Kidney Diseases (Instituto Nacional de la Diabetes y las Enfermedades Digestivas y Renales): niddk.nih.gov Association of Diabetes   Care & Education Specialists (Asociacin de Especialistas en Atencin y Educacin sobre la Diabetes):  diabeteseducator.org Resumen Es importante tener hbitos alimenticios saludables debido a que sus niveles de azcar en la sangre (glucosa) se ven afectados en gran medida por lo que come y bebe. Es importante consumir alcohol con prudencia. Un plan de comidas saludable lo ayudar a controlar la glucosa en sangre y a reducir el riesgo de enfermedades cardacas. El mdico puede recomendarle que trabaje con un nutricionista para elaborar el mejor plan para usted. Esta informacin no tiene como fin reemplazar el consejo del mdico. Asegrese de hacerle al mdico cualquier pregunta que tenga. Document Revised: 12/13/2019 Document Reviewed: 12/13/2019 Elsevier Patient Education  2023 Elsevier Inc.  

## 2022-03-09 NOTE — Progress Notes (Signed)
Subjective:  Patient ID: Linda Ferguson, female    DOB: Mar 17, 1946  Age: 76 y.o. MRN: [id number]  CC: Medication Refill   HPI Linda Ferguson is a 76 y.o. year old female with a history of HTN, Hyperlipidemia, Depression, type 2 diabetes mellitus (A1c 6.5) here for an office visit.   Interval History: At her last visit with the clinical pharmacist for blood pressure evaluation amlodipine was initiated and she has been taking this along with the lisinopril/HCTZ, 100/25 mg daily and her blood pressure is normal today.  Her last set of labs revealed a new diagnosis of type 2 diabetes mellitus with A1c of 6.5 for which I had sent the prescription for metformin to her pharmacy.  She endorses adherence with this. She has had no hypoglycemia, neuropathy or visual concerns. Her husband has a history of diabetes mellitus and the concept of diabetes mellitus is not new to her. Denies additional concerns today. Past Medical History:  Diagnosis Date   Atrial fibrillation [facility])    Splenic artery aneurysm ([place])     History reviewed. No pertinent surgical history.  History reviewed. No pertinent family history.  Social History   Socioeconomic History   Marital status: Single    Spouse name: Not on file   Number of children: Not on file   Years of education: Not on file   Highest education level: Not on file  Occupational History   Not on file  Tobacco Use   Smoking status: Never   Smokeless tobacco: Never  Vaping Use   Vaping Use: Never used  Substance and Sexual Activity   Alcohol use: No   Drug use: No   Sexual activity: Not on file  Other Topics Concern   Not on file  Social History Narrative   Not on file   Social Determinants of Health   Financial Resource Strain: Not on file  Food Insecurity: Not on file  Transportation Needs: Not on file  Physical Activity: Not on file  Stress: Not on file  Social Connections: Not on file    Allergies  Allergen  Reactions   Allegra-D Allergy & Congestion [Fexofenadine-Pseudoephed Er] Palpitations    Outpatient Medications Prior to Visit  Medication Sig Dispense Refill   amLODipine (NORVASC) 5 MG tablet Take 1 tablet (5 mg total) by mouth daily. 90 tablet 1   cetirizine (ZYRTEC) 10 MG tablet Take 1 tablet (10 mg total) by mouth daily. 30 tablet 1   fluticasone (FLONASE) 50 MCG/ACT nasal spray Place 2 sprays into both nostrils daily. 16 g 6   hydrOXYzine (ATARAX) 25 MG tablet TAKE 1 TABLET (25 MG TOTAL) BY MOUTH AT BEDTIME AS NEEDED. 30 tablet 1   losartan-hydrochlorothiazide (HYZAAR) 100-25 MG tablet Take 1 tablet by mouth daily. 90 tablet 1   metFORMIN (GLUCOPHAGE) 500 MG tablet Take 1 tablet (500 mg total) by mouth daily with breakfast. 30 tablet 3   aspirin EC 81 MG tablet Take 81 mg by mouth daily.     lovastatin (MEVACOR) 40 MG tablet Take 1 tablet (40 mg total) by mouth at bedtime. 90 tablet 1   cholecalciferol (VITAMIN D) 400 units TABS tablet Take 400 Units by mouth. (Patient not taking: Reported on 12/16/2021)     escitalopram (LEXAPRO) 10 MG tablet Take 1 tablet (10 mg total) by mouth daily. 90 tablet 1   pantoprazole (PROTONIX) 40 MG tablet Take 1 tablet (40 mg total) by mouth daily. (Patient not taking: Reported on 03/09/2022) 90 tablet 1  No facility-administered medications prior to visit.     ROS Review of Systems  Constitutional:  Negative for activity change and appetite change.  HENT:  Negative for sinus pressure and sore throat.   Respiratory:  Negative for chest tightness, shortness of breath and wheezing.   Cardiovascular:  Negative for chest pain and palpitations.  Gastrointestinal:  Negative for abdominal distention, abdominal pain and constipation.  Genitourinary: Negative.   Musculoskeletal: Negative.   Psychiatric/Behavioral:  Negative for behavioral problems and dysphoric mood.     Objective:  BP 125/72 (BP Location: Left Arm, Patient Position: Sitting, Cuff Size:  Normal)   Pulse 86   Ht _0  (1.651 m)   Wt 158 lb 9.6 oz (71.9 kg)   LMP  (LMP Unknown)   SpO2 94%   BMI 26.39 kg/m      03/09/2022    9:55 AM 01/22/2022    9:40 AM 12/16/2021   10:23 AM  BP/Weight  Systolic BP 099 833 825  Diastolic BP 72 73 73  Wt. (Lbs) 158.6    BMI 26.39 kg/m2        Physical Exam Constitutional:      Appearance: She is well-developed.  Cardiovascular:     Rate and Rhythm: Normal rate.     Heart sounds: Normal heart sounds. No murmur heard. Pulmonary:     Effort: Pulmonary effort is normal.     Breath sounds: Normal breath sounds. No wheezing or rales.  Chest:     Chest wall: No tenderness.  Abdominal:     General: Bowel sounds are normal. There is no distension.     Palpations: Abdomen is soft. There is no mass.     Tenderness: There is no abdominal tenderness.  Musculoskeletal:        General: Normal range of motion.     Right lower leg: No edema.     Left lower leg: No edema.  Neurological:     Mental Status: She is alert and oriented to person, place, and time.  Psychiatric:        Mood and Affect: Mood normal.        Latest Ref Rng & Units 01/22/2022    9:49 AM 11/04/2021    9:42 AM 11/25/2007   12:00 AM  CMP  Glucose 70 - 99 mg/dL 100  110  95   BUN 8 - 27 mg/dL _1 Creatinine 0.57 - 1.00 mg/dL 0.94  1.05  0.78   Sodium 134 - 144 mmol/L 142  142  142   Potassium 3.5 - 5.2 mmol/L 4.4  4.4  4.4   Chloride 96 - 106 mmol/L 98  98  106   CO2 20 - 29 mmol/L _2 Calcium 8.7 - 10.3 mg/dL 9.3  9.8  8.8   Total Protein 6.0 - 8.5 g/dL 7.1  7.4  7.4   Total Bilirubin 0.0 - 1.2 mg/dL 0.7  0.8  0.5   Alkaline Phos 44 - 121 IU/L 101  100  90   AST 0 - 40 IU/L _3 ALT 0 - 32 IU/L _4 Lipid Panel     Component Value Date/Time   CHOL 253 (H) 11/04/2021 0942   TRIG 205 (H) 11/04/2021 0942   HDL 65 11/04/2021 0942   CHOLHDL 4.3 Ratio 11/25/2007 0000   VLDL 46 (H) 11/25/2007 0000  LDLCALC 151 (H)  11/04/2021 0942   LDLDIRECT 118.9 11/11/2006 1144    CBC    Component Value Date/Time   WBC 5.9 11/04/2021 0942   WBC 5.5 11/25/2007 0000   RBC 4.72 11/04/2021 0942   RBC 4.62 11/25/2007 0000   HGB 14.6 11/04/2021 0942   HCT 44.1 11/04/2021 0942   PLT 333 11/04/2021 0942   MCV 93 11/04/2021 0942   MCH 30.9 11/04/2021 0942   MCHC 33.1 11/04/2021 0942   MCHC 32.4 11/25/2007 0000   RDW 14.1 11/04/2021 0942   LYMPHSABS 2.4 11/04/2021 0942   MONOABS 0.3 11/25/2007 0000   EOSABS 0.2 11/04/2021 0942   BASOSABS 0.1 11/04/2021 0942    Lab Results  Component Value Date   HGBA1C 6.5 (H) 01/22/2022    Assessment & Plan:  1. Type 2 diabetes mellitus with other specified complication, without long-term current use of insulin ([place]) Newly diagnosed with A1c of 6.5 Continue metformin Counseled on Diabetic diet, my plate method, 829 minutes of moderate intensity exercise/week Blood sugar logs with fasting goals of 80-120 mg/dl, random of less than 180 and in the event of sugars less than 60 mg/dl or greater than 400 mg/dl encouraged to notify the clinic. Advised on the need for annual eye exams, annual foot exams, Pneumonia vaccine. - LP+Non-HDL Cholesterol - Microalbumin/Creatinine Ratio, Urine - Blood Glucose Monitoring Suppl (ONETOUCH VERIO) w/Device KIT; 1 each by Does not apply route daily before breakfast.  Dispense: 1 kit; Refill: 0 - glucose blood (ONETOUCH VERIO) test strip; Use as instructed  Dispense: 100 each; Refill: 12 - OneTouch Delica Lancets 93Z MISC; 1 each by Does not apply route daily before breakfast.  Dispense: 30 each; Refill: 11 - metFORMIN (GLUCOPHAGE) 500 MG tablet; Take 1 tablet (500 mg total) by mouth daily with breakfast.  Dispense: 90 tablet; Refill: 1  2. Primary hypertension Controlled Continue amlodipine and losartan/HCTZ Counseled on blood pressure goal of less than 130/80, low-sodium, DASH diet, medication compliance, 150 minutes of moderate intensity  exercise per week. Discussed medication compliance, adverse effects. - losartan-hydrochlorothiazide (HYZAAR) 100-25 MG tablet; Take 1 tablet by mouth daily.  Dispense: 90 tablet; Refill: 1  3. Moderate episode of recurrent major depressive disorder (HCC) Controlled - escitalopram (LEXAPRO) 10 MG tablet; Take 1 tablet (10 mg total) by mouth daily.  Dispense: 90 tablet; Refill: 1    Meds ordered this encounter  Medications   Blood Glucose Monitoring Suppl (ONETOUCH VERIO) w/Device KIT    Sig: 1 each by Does not apply route daily before breakfast.    Dispense:  1 kit    Refill:  0   glucose blood (ONETOUCH VERIO) test strip    Sig: Use as instructed    Dispense:  100 each    Refill:  12   OneTouch Delica Lancets 16R MISC    Sig: 1 each by Does not apply route daily before breakfast.    Dispense:  30 each    Refill:  11   metFORMIN (GLUCOPHAGE) 500 MG tablet    Sig: Take 1 tablet (500 mg total) by mouth daily with breakfast.    Dispense:  90 tablet    Refill:  1   escitalopram (LEXAPRO) 10 MG tablet    Sig: Take 1 tablet (10 mg total) by mouth daily.    Dispense:  90 tablet    Refill:  1   losartan-hydrochlorothiazide (HYZAAR) 100-25 MG tablet    Sig: Take 1 tablet by mouth daily.  Dispense:  90 tablet    Refill:  1    Follow-up: Return in about 6 months (around 09/07/2022) for Chronic medical conditions.       Charlott Rakes, MD, FAAFP. [facility] and [place], [place]   03/09/2022, 10:47 AM

## 2022-03-10 LAB — LP+NON-HDL CHOLESTEROL
Cholesterol, Total: 187 mg/dL (ref 100–199)
HDL: 63 mg/dL (ref >39)
LDL Chol Calc (NIH): 95 mg/dL (ref 0–99)
Total Non-HDL-Chol (LDL+VLDL): 124 mg/dL (ref 0–129)
Triglycerides: 172 mg/dL — ABNORMAL HIGH (ref 0–149)
VLDL Cholesterol Cal: 29 mg/dL (ref 5–40)

## 2022-03-10 LAB — MICROALBUMIN / CREATININE URINE RATIO
Creatinine, Urine: 51.7 mg/dL
Microalb/Creat Ratio: <6 mg/g creat (ref 0–29)
Microalbumin, Urine: <3 ug/mL

## 2022-03-11 ENCOUNTER — Other Ambulatory Visit: Payer: Self-pay | Admitting: Family Medicine

## 2022-03-11 DIAGNOSIS — G4709 Other insomnia: Secondary | ICD-10-CM

## 2022-05-04 ENCOUNTER — Other Ambulatory Visit: Payer: Self-pay | Admitting: Family Medicine

## 2022-05-04 DIAGNOSIS — K219 Gastro-esophageal reflux disease without esophagitis: Secondary | ICD-10-CM

## 2022-05-04 DIAGNOSIS — G4709 Other insomnia: Secondary | ICD-10-CM

## 2022-05-04 DIAGNOSIS — I10 Essential (primary) hypertension: Secondary | ICD-10-CM

## 2022-05-04 MED ORDER — HYDROXYZINE HCL 25 MG PO TABS: 25.0000 mg | ORAL_TABLET | Freq: Every evening | ORAL | 0 refills | Status: DC | PRN

## 2022-05-04 NOTE — Telephone Encounter (Signed)
Requested Prescriptions  Pending Prescriptions Disp Refills   losartan-hydrochlorothiazide (HYZAAR) 100-25 MG tablet 90 tablet 1    Sig: Take 1 tablet by mouth daily.     Cardiovascular: ARB + Diuretic Combos Passed - 05/04/2022 10:31 AM      Passed - K in normal range and within 180 days    Potassium  Date Value Ref Range Status  01/22/2022 4.4 3.5 - 5.2 mmol/L Final         Passed - Na in normal range and within 180 days    Sodium  Date Value Ref Range Status  01/22/2022 142 134 - 144 mmol/L Final         Passed - Cr in normal range and within 180 days    Creatinine, Ser  Date Value Ref Range Status  01/22/2022 0.94 0.57 - 1.00 mg/dL Final         Passed - eGFR is 10 or above and within 180 days    GFR calc Af Amer  Date Value Ref Range Status  11/11/2006 93 mL/min Final   GFR calc non Af Amer  Date Value Ref Range Status  11/11/2006 77 mL/min Final   eGFR  Date Value Ref Range Status  01/22/2022 63 >59 mL/min/1.73 Final         Passed - Patient is not pregnant      Passed - Last BP in normal range    BP Readings from Last 1 Encounters:  03/09/22 125/72         Passed - Valid encounter within last 6 months    Recent Outpatient Visits           1 month ago Type 2 diabetes mellitus with other specified complication, without long-term current use of insulin ([place])   [place], Enobong, MD   3 months ago Primary hypertension   [place], Jarome Matin, RPH-CPP   4 months ago Primary hypertension   [place], Charlane Ferretti, MD   6 months ago Mixed hyperlipidemia   [place], Charlane Ferretti, MD       Future Appointments             In 4 months Charlott Rakes, MD [place]             hydrOXYzine (ATARAX) 25 MG tablet 90 tablet 0    Sig: Take 1 tablet (25 mg total) by mouth at  bedtime as needed.     Ear, Nose, and Throat:  Antihistamines 2 Passed - 05/04/2022 10:31 AM      Passed - Cr in normal range and within 360 days    Creatinine, Ser  Date Value Ref Range Status  01/22/2022 0.94 0.57 - 1.00 mg/dL Final         Passed - Valid encounter within last 12 months    Recent Outpatient Visits           1 month ago Type 2 diabetes mellitus with other specified complication, without long-term current use of insulin ([place])   [place], Enobong, MD   3 months ago Primary hypertension   [place], Jarome Matin, RPH-CPP   4 months ago Primary hypertension   [place], Charlane Ferretti, MD   6 months ago Mixed hyperlipidemia   [facility]  [place], Charlane Ferretti, MD       Future Appointments             In 4 months Charlott Rakes, MD [place]             pantoprazole (PROTONIX) 40 MG tablet 90 tablet 1    Sig: Take 1 tablet (40 mg total) by mouth daily.     Gastroenterology: Proton Pump Inhibitors Passed - 05/04/2022 10:31 AM      Passed - Valid encounter within last 12 months    Recent Outpatient Visits           1 month ago Type 2 diabetes mellitus with other specified complication, without long-term current use of insulin ([place])   [place], Charlane Ferretti, MD   3 months ago Primary hypertension   [place], Jarome Matin, RPH-CPP   4 months ago Primary hypertension   [place], Enobong, MD   6 months ago Mixed hyperlipidemia   [place], Enobong, MD       Future Appointments             In 4 months Charlott Rakes, MD [place]

## 2022-05-04 NOTE — Telephone Encounter (Signed)
Unable to refill per protocol, Protonix expired/discontinued 03/09/22. Losartan request is too soon. Will refuse.  Requested Prescriptions  Pending Prescriptions Disp Refills   losartan-hydrochlorothiazide (HYZAAR) 100-25 MG tablet 90 tablet 1    Sig: Take 1 tablet by mouth daily.     Cardiovascular: ARB + Diuretic Combos Passed - 05/04/2022 10:31 AM      Passed - K in normal range and within 180 days    Potassium  Date Value Ref Range Status  01/22/2022 4.4 3.5 - 5.2 mmol/L Final         Passed - Na in normal range and within 180 days    Sodium  Date Value Ref Range Status  01/22/2022 142 134 - 144 mmol/L Final         Passed - Cr in normal range and within 180 days    Creatinine, Ser  Date Value Ref Range Status  01/22/2022 0.94 0.57 - 1.00 mg/dL Final         Passed - eGFR is 10 or above and within 180 days    GFR calc Af Amer  Date Value Ref Range Status  11/11/2006 93 mL/min Final   GFR calc non Af Amer  Date Value Ref Range Status  11/11/2006 77 mL/min Final   eGFR  Date Value Ref Range Status  01/22/2022 63 >59 mL/min/1.73 Final         Passed - Patient is not pregnant      Passed - Last BP in normal range    BP Readings from Last 1 Encounters:  03/09/22 125/72         Passed - Valid encounter within last 6 months    Recent Outpatient Visits           1 month ago Type 2 diabetes mellitus with other specified complication, without long-term current use of insulin ([place])   [place], Enobong, MD   3 months ago Primary hypertension   [place], Jarome Matin, RPH-CPP   4 months ago Primary hypertension   [place], Charlane Ferretti, MD   6 months ago Mixed hyperlipidemia   [place], Charlane Ferretti, MD       Future Appointments             In 4 months Charlott Rakes, MD [place]             pantoprazole (PROTONIX) 40 MG tablet 90 tablet 1    Sig: Take 1 tablet (40 mg total) by mouth daily.     Gastroenterology: Proton Pump Inhibitors Passed - 05/04/2022 10:31 AM      Passed - Valid encounter within last 12 months    Recent Outpatient Visits           1 month ago Type 2 diabetes mellitus with other specified complication, without long-term current use of insulin ([place])   [place] Charlott Rakes, MD   3 months ago Primary hypertension   [place], Jarome Matin, RPH-CPP   4 months ago Primary hypertension   [place], Enobong, MD   6 months ago Mixed hyperlipidemia   [place], Enobong, MD       Future Appointments  In 4 months Charlott Rakes, MD [address]            Signed Prescriptions Disp Refills   hydrOXYzine (ATARAX) 25 MG tablet 90 tablet 0    Sig: Take 1 tablet (25 mg total) by mouth at bedtime as needed.     Ear, Nose, and Throat:  Antihistamines 2 Passed - 05/04/2022 10:31 AM      Passed - Cr in normal range and within 360 days    Creatinine, Ser  Date Value Ref Range Status  01/22/2022 0.94 0.57 - 1.00 mg/dL Final         Passed - Valid encounter within last 12 months    Recent Outpatient Visits           1 month ago Type 2 diabetes mellitus with other specified complication, without long-term current use of insulin ([place])   [place], Charlane Ferretti, MD   3 months ago Primary hypertension   [place], Jarome Matin, RPH-CPP   4 months ago Primary hypertension   [place], Charlane Ferretti, MD   6 months ago Mixed hyperlipidemia   [place], Enobong, MD       Future Appointments              In 4 months Charlott Rakes, MD [place]

## 2022-05-04 NOTE — Telephone Encounter (Addendum)
Medication Refill - Medication: losartan-hydrochlorothiazide (HYZAAR) 100-25 MG tablet , hydrOXYzine (ATARAX) 25 MG tablet , pantoprazole (PROTONIX) 40 MG table   Has the patient contacted their pharmacy? No. (Agent: If no, request that the patient contact the pharmacy for the refill. If patient does not wish to contact the pharmacy document the reason why and proceed with request.)   Preferred Pharmacy (with phone number or street name):  [place], [place] - [address]  [place] [address]  Phone: [phone] Fax: [phone]  Hours: Not open 24 hours   Has the patient been seen for an appointment in the last year OR does the patient have an upcoming appointment? Yes.    Agent: Please be advised that RX refills may take up to 3 business days. We ask that you follow-up with your pharmacy.

## 2022-06-03 ENCOUNTER — Other Ambulatory Visit: Payer: Self-pay | Admitting: Family Medicine

## 2022-06-03 DIAGNOSIS — G4709 Other insomnia: Secondary | ICD-10-CM

## 2022-06-03 DIAGNOSIS — K219 Gastro-esophageal reflux disease without esophagitis: Secondary | ICD-10-CM

## 2022-06-03 DIAGNOSIS — J3089 Other allergic rhinitis: Secondary | ICD-10-CM

## 2022-09-08 ENCOUNTER — Ambulatory Visit: Payer: Commercial Managed Care - HMO | Admitting: Family Medicine

## 2022-10-06 ENCOUNTER — Other Ambulatory Visit: Payer: Self-pay

## 2022-10-06 ENCOUNTER — Encounter (HOSPITAL_COMMUNITY): Payer: Self-pay | Admitting: Emergency Medicine

## 2022-10-06 ENCOUNTER — Ambulatory Visit (HOSPITAL_COMMUNITY)
Admission: EM | Admit: 2022-10-06 | Discharge: 2022-10-06 | Disposition: A | Discharge status: Home / Self Care | Payer: No Typology Code available for payment source | Attending: Family Medicine | Admitting: Family Medicine

## 2022-10-06 ENCOUNTER — Emergency Department (HOSPITAL_COMMUNITY): Payer: No Typology Code available for payment source

## 2022-10-06 ENCOUNTER — Emergency Department (HOSPITAL_COMMUNITY)
Admission: EM | Admit: 2022-10-06 | Discharge: 2022-10-07 | Disposition: A | Discharge status: Home / Self Care | Payer: No Typology Code available for payment source | Attending: Emergency Medicine | Admitting: Emergency Medicine

## 2022-10-06 DIAGNOSIS — R110 Nausea: Secondary | ICD-10-CM | POA: Insufficient documentation

## 2022-10-06 DIAGNOSIS — R42 Dizziness and giddiness: Secondary | ICD-10-CM | POA: Diagnosis not present

## 2022-10-06 DIAGNOSIS — I10 Essential (primary) hypertension: Secondary | ICD-10-CM | POA: Diagnosis not present

## 2022-10-06 DIAGNOSIS — Z79899 Other long term (current) drug therapy: Secondary | ICD-10-CM | POA: Diagnosis not present

## 2022-10-06 DIAGNOSIS — Z7984 Long term (current) use of oral hypoglycemic drugs: Secondary | ICD-10-CM | POA: Diagnosis not present

## 2022-10-06 DIAGNOSIS — R0602 Shortness of breath: Secondary | ICD-10-CM | POA: Insufficient documentation

## 2022-10-06 DIAGNOSIS — R079 Chest pain, unspecified: Secondary | ICD-10-CM | POA: Diagnosis not present

## 2022-10-06 DIAGNOSIS — Z7982 Long term (current) use of aspirin: Secondary | ICD-10-CM | POA: Diagnosis not present

## 2022-10-06 LAB — CBC WITH DIFFERENTIAL/PLATELET
Abs Immature Granulocytes: 0.02 10*3/uL (ref 0.00–0.07)
Basophils Absolute: 0.1 10*3/uL (ref 0.0–0.1)
Basophils Relative: 1 %
Eosinophils Absolute: 0.1 10*3/uL (ref 0.0–0.5)
Eosinophils Relative: 2 %
HCT: 40.4 % (ref 36.0–46.0)
Hemoglobin: 13.3 g/dL (ref 12.0–15.0)
Immature Granulocytes: 0 %
Lymphocytes Relative: 18 %
Lymphs Abs: 1.4 10*3/uL (ref 0.7–4.0)
MCH: 31.1 pg (ref 26.0–34.0)
MCHC: 32.9 g/dL (ref 30.0–36.0)
MCV: 94.4 fL (ref 80.0–100.0)
Monocytes Absolute: 0.3 10*3/uL (ref 0.1–1.0)
Monocytes Relative: 4 %
Neutro Abs: 5.7 10*3/uL (ref 1.7–7.7)
Neutrophils Relative %: 75 %
Platelets: 260 10*3/uL (ref 150–400)
RBC: 4.28 MIL/uL (ref 3.87–5.11)
RDW: 13.9 % (ref 11.5–15.5)
WBC: 7.7 10*3/uL (ref 4.0–10.5)
nRBC: 0 % (ref 0.0–0.2)

## 2022-10-06 LAB — TROPONIN I (HIGH SENSITIVITY)
Troponin I (High Sensitivity): 4 ng/L (ref <18)
Troponin I (High Sensitivity): 4 ng/L (ref <18)

## 2022-10-06 LAB — COMPREHENSIVE METABOLIC PANEL
ALT: 16 U/L (ref 0–44)
AST: 25 U/L (ref 15–41)
Albumin: 4.4 g/dL (ref 3.5–5.0)
Alkaline Phosphatase: 89 U/L (ref 38–126)
Anion gap: 10 (ref 5–15)
BUN: 26 mg/dL — ABNORMAL HIGH (ref 8–23)
CO2: 22 mmol/L (ref 22–32)
Calcium: 8.8 mg/dL — ABNORMAL LOW (ref 8.9–10.3)
Chloride: 103 mmol/L (ref 98–111)
Creatinine, Ser: 0.96 mg/dL (ref 0.44–1.00)
GFR, Estimated: >60 mL/min (ref >60)
Glucose, Bld: 156 mg/dL — ABNORMAL HIGH (ref 70–99)
Potassium: 3.8 mmol/L (ref 3.5–5.1)
Sodium: 135 mmol/L (ref 135–145)
Total Bilirubin: 1 mg/dL (ref 0.3–1.2)
Total Protein: 7.7 g/dL (ref 6.5–8.1)

## 2022-10-06 LAB — I-STAT CHEM 8, ED
BUN: 28 mg/dL — ABNORMAL HIGH (ref 8–23)
Calcium, Ion: 1.11 mmol/L — ABNORMAL LOW (ref 1.15–1.40)
Chloride: 102 mmol/L (ref 98–111)
Creatinine, Ser: 0.9 mg/dL (ref 0.44–1.00)
Glucose, Bld: 113 mg/dL — ABNORMAL HIGH (ref 70–99)
HCT: 39 % (ref 36.0–46.0)
Hemoglobin: 13.3 g/dL (ref 12.0–15.0)
Potassium: 4.1 mmol/L (ref 3.5–5.1)
Sodium: 135 mmol/L (ref 135–145)
TCO2: 27 mmol/L (ref 22–32)

## 2022-10-06 MED ORDER — MECLIZINE HCL 25 MG PO TABS
25.0000 mg | ORAL_TABLET | Freq: Once | ORAL | Status: AC
  Administered 2022-10-06: 25 mg via ORAL
  Filled 2022-10-06: qty 1

## 2022-10-06 MED ORDER — NITROGLYCERIN 0.4 MG SL SUBL
0.4000 mg | SUBLINGUAL_TABLET | SUBLINGUAL | Status: DC | PRN
  Administered 2022-10-06: 0.4 mg via SUBLINGUAL
  Filled 2022-10-06: qty 1

## 2022-10-06 MED ORDER — IOHEXOL 350 MG/ML SOLN
100.0000 mL | Freq: Once | INTRAVENOUS | Status: AC | PRN
  Administered 2022-10-06: 100 mL via INTRAVENOUS

## 2022-10-06 NOTE — ED Provider Notes (Signed)
Linda Ferguson Provider Note   CSN: [id number] Arrival date & time: 10/06/22  1937     History {Add pertinent medical, surgical, social history, OB history to HPI:1} Chief Complaint  Patient presents with   Chest Pain    Karilynn Sherbondy is a 77 y.o. female, history of A-fib, who presents to the ED secondary to chest pain that started around 445, as well as dizziness.  States she was at work when the new current amount 4 hours ago, and came on all of a sudden, she had sudden pressure to her chest, nausea and radiation to her left jaw.  Also states that she had acute onset of dizziness at this time and felt like something was buzzing in her left ear.  Denies any kind of vomiting, does endorse some shortness of breath with this.  States she has never had this happen before.  Notes that the pain has been persistent, not going away, she is not taking anything for this pain.  States that she has not fallen or hit her head and denies any recent illnesses/surgeries.  Denies any weakness on one side of the body, or changes in speech    Home Medications Prior to Admission medications   Medication Sig Start Date End Date Taking? Authorizing Provider  amLODipine (NORVASC) 5 MG tablet TAKE 1 TABLET (5 MG TOTAL) BY MOUTH DAILY. 06/03/22   Hoy Register, MD  aspirin EC 81 MG tablet Take 81 mg by mouth daily.    [provider]  Blood Glucose Monitoring Suppl (ONETOUCH VERIO) w/Device KIT 1 each by Does not apply route daily before breakfast. 03/09/22   Hoy Register, MD  cetirizine (ZYRTEC) 10 MG tablet Take 1 tablet (10 mg total) by mouth daily. 11/04/21   Hoy Register, MD  escitalopram (LEXAPRO) 10 MG tablet Take 1 tablet (10 mg total) by mouth daily. 03/09/22   Hoy Register, MD  fluticasone (FLONASE) 50 MCG/ACT nasal spray PLACE 2 SPRAYS INTO BOTH NOSTRILS DAILY. 06/03/22   Hoy Register, MD  glucose blood (ONETOUCH VERIO) test strip  Use as instructed 03/09/22   Hoy Register, MD  hydrOXYzine (ATARAX) 25 MG tablet TAKE 1 TABLET (25 MG TOTAL) BY MOUTH AT BEDTIME AS NEEDED. 06/03/22   Hoy Register, MD  losartan-hydrochlorothiazide (HYZAAR) 100-25 MG tablet Take 1 tablet by mouth daily. 03/09/22   Hoy Register, MD  lovastatin (MEVACOR) 40 MG tablet Take 1 tablet (40 mg total) by mouth at bedtime. 12/16/21   Hoy Register, MD  metFORMIN (GLUCOPHAGE) 500 MG tablet Take 1 tablet (500 mg total) by mouth daily with breakfast. 03/09/22   Hoy Register, MD  OneTouch Delica Lancets 33G MISC 1 each by Does not apply route daily before breakfast. 03/09/22   Hoy Register, MD      Allergies    Allegra-d allergy & congestion [fexofenadine-pseudoephed er]    Review of Systems   Review of Systems  Cardiovascular:  Positive for chest pain.  Neurological:  Positive for dizziness. Negative for headaches.    Physical Exam Updated Vital Signs BP (!) 206/90 (BP Location: Right Arm)   Pulse (!) 58   Temp 98.7 F (37.1 C) (Oral)   Resp 18   LMP  (LMP Unknown)   SpO2 100%  Physical Exam Vitals and nursing note reviewed.  Constitutional:      General: She is not in acute distress.    Appearance: She is well-developed.  HENT:     Head: Normocephalic and  atraumatic.  Eyes:     Conjunctiva/sclera: Conjunctivae normal.  Cardiovascular:     Rate and Rhythm: Normal rate and regular rhythm.     Heart sounds: No murmur heard. Pulmonary:     Effort: Pulmonary effort is normal. No respiratory distress.     Breath sounds: Normal breath sounds.  Abdominal:     Palpations: Abdomen is soft.     Tenderness: There is no abdominal tenderness.  Musculoskeletal:        General: No swelling.     Cervical back: Neck supple.  Skin:    General: Skin is warm and dry.     Capillary Refill: Capillary refill takes less than 2 seconds.  Neurological:     General: No focal deficit present.     Mental Status: She is alert and oriented to  person, place, and time.     Cranial Nerves: Cranial nerves 2-12 are intact.     Sensory: Sensation is intact.     Motor: Motor function is intact.  Psychiatric:        Mood and Affect: Mood normal.     ED Results / Procedures / Treatments   Labs (all labs ordered are listed, but only abnormal results are displayed) Labs Reviewed  COMPREHENSIVE METABOLIC PANEL  CBC WITH DIFFERENTIAL/PLATELET  TROPONIN I (HIGH SENSITIVITY)    EKG None  Radiology No results found.  Procedures Procedures  {Document cardiac monitor, telemetry assessment procedure when appropriate:1}  Medications Ordered in ED Medications - No data to display  ED Course/ Medical Decision Making/ A&P   {   Click here for ABCD2, HEART and other calculatorsREFRESH Note before signing :1}                          Medical Decision Making Amount and/or Complexity of Data Reviewed Labs: ordered. Radiology: ordered.  Risk Prescription drug management.   ***  {Document critical care time when appropriate:1} {Document review of labs and clinical decision tools ie heart score, Chads2Vasc2 etc:1}  {Document your independent review of radiology images, and any outside records:1} {Document your discussion with family members, caretakers, and with consultants:1} {Document social determinants of health affecting pt's care:1} {Document your decision making why or why not admission, treatments were needed:1} Final Clinical Impression(s) / ED Diagnoses Final diagnoses:  None    Rx / DC Orders ED Discharge Orders     None

## 2022-10-06 NOTE — ED Provider Notes (Signed)
[facility]    CSN: [id number] Arrival date & time: 10/06/22  1758      History   Chief Complaint Chief Complaint  Patient presents with   Dizziness    Spanish speaker pt states she was at work and got out at 5 pm for a break she had 2 yogurt and after that she started felling very dizzy and nauseous. Pt is AO x 4 during triage no neuro deficit.    HPI Linda Ferguson is a 77 y.o. female.    Dizziness  here for sudden onset of vertigo.  It began at approximately 5 PM today.  No headache.  She is also nauseated.  She does not really note increase of her vertigo when she turns to the side or bends over.  She states she is not taking medication for diabetes as she was told that she really did not have diabetes.  She is taking medication for hypertension.  Past Medical History:  Diagnosis Date   Atrial fibrillation [facility])    Splenic artery aneurysm [facility])     Patient Active Problem List   Diagnosis Date Noted   Type 2 diabetes mellitus with other specified complication (HCC) 01/23/2022   GERD (gastroesophageal reflux disease) 12/16/2021   Splenic artery aneurysm (HCC) 05/21/2017   ONYCHOMYCOSIS, TOENAILS 08/19/2007   TINNITUS, CHRONIC 08/04/2007   Allergic rhinitis 08/04/2007   Major depressive disorder 11/11/2006   Hyperlipidemia 09/22/2006   Primary hypertension 09/22/2006    History reviewed. No pertinent surgical history.  OB History   No obstetric history on file.      Home Medications    Prior to Admission medications   Medication Sig Start Date End Date Taking? Authorizing Provider  amLODipine (NORVASC) 5 MG tablet TAKE 1 TABLET (5 MG TOTAL) BY MOUTH DAILY. 06/03/22   Hoy Register, MD  aspirin EC 81 MG tablet Take 81 mg by mouth daily.    [provider]  Blood Glucose Monitoring Suppl (ONETOUCH VERIO) w/Device KIT 1 each by Does not apply route daily before breakfast. 03/09/22   Hoy Register, MD  cetirizine (ZYRTEC) 10  MG tablet Take 1 tablet (10 mg total) by mouth daily. 11/04/21   Hoy Register, MD  escitalopram (LEXAPRO) 10 MG tablet Take 1 tablet (10 mg total) by mouth daily. 03/09/22   Hoy Register, MD  fluticasone (FLONASE) 50 MCG/ACT nasal spray PLACE 2 SPRAYS INTO BOTH NOSTRILS DAILY. 06/03/22   Hoy Register, MD  glucose blood (ONETOUCH VERIO) test strip Use as instructed 03/09/22   Hoy Register, MD  hydrOXYzine (ATARAX) 25 MG tablet TAKE 1 TABLET (25 MG TOTAL) BY MOUTH AT BEDTIME AS NEEDED. 06/03/22   Hoy Register, MD  losartan-hydrochlorothiazide (HYZAAR) 100-25 MG tablet Take 1 tablet by mouth daily. 03/09/22   Hoy Register, MD  lovastatin (MEVACOR) 40 MG tablet Take 1 tablet (40 mg total) by mouth at bedtime. 12/16/21   Hoy Register, MD  metFORMIN (GLUCOPHAGE) 500 MG tablet Take 1 tablet (500 mg total) by mouth daily with breakfast. 03/09/22   Hoy Register, MD  OneTouch Delica Lancets 33G MISC 1 each by Does not apply route daily before breakfast. 03/09/22   Hoy Register, MD    Family History History reviewed. No pertinent family history.  Social History Social History   Tobacco Use   Smoking status: Never   Smokeless tobacco: Never  Vaping Use   Vaping Use: Never used  Substance Use Topics   Alcohol use: No   Drug use: No  Allergies   Allegra-d allergy & congestion [fexofenadine-pseudoephed er]   Review of Systems Review of Systems  Neurological:  Positive for dizziness.     Physical Exam Triage Vital Signs ED Triage Vitals  Enc Vitals Group     BP 10/06/22 1903 (!) 184/98     Pulse Rate 10/06/22 1903 (!) 53     Resp 10/06/22 1903 18     Temp 10/06/22 1903 98.2 F (36.8 C)     Temp Source 10/06/22 1903 Oral     SpO2 10/06/22 1903 98 %     Weight 10/06/22 1904 158 lb 11.7 oz (72 kg)     Height 10/06/22 1904 5\' 5"  (1.651 m)     Head Circumference --      Peak Flow --      Pain Score 10/06/22 1904 0     Pain Loc --      Pain Edu? --       Excl. in GC? --    No data found.  Updated Vital Signs BP (!) 184/98 (BP Location: Right Arm)   Pulse (!) 53   Temp 98.2 F (36.8 C) (Oral)   Resp 18   Ht 5\' 5"  (1.651 m)   Wt 72 kg   LMP  (LMP Unknown)   SpO2 98%   BMI 26.41 kg/m   Visual Acuity Right Eye Distance:   Left Eye Distance:   Bilateral Distance:    Right Eye Near:   Left Eye Near:    Bilateral Near:     Physical Exam Vitals reviewed.  Constitutional:      General: She is not in acute distress.    Appearance: She is not ill-appearing, toxic-appearing or diaphoretic.  HENT:     Nose: Nose normal.     Mouth/Throat:     Mouth: Mucous membranes are dry.  Eyes:     Conjunctiva/sclera: Conjunctivae normal.     Pupils: Pupils are equal, round, and reactive to light.     Comments: There is a little nystagmus on lateral gaze to the right.  Cardiovascular:     Rate and Rhythm: Normal rate and regular rhythm.  Pulmonary:     Effort: Pulmonary effort is normal.     Breath sounds: Normal breath sounds.  Musculoskeletal:     Cervical back: Neck supple.  Lymphadenopathy:     Cervical: No cervical adenopathy.  Skin:    Coloration: Skin is not pale.  Neurological:     Mental Status: She is oriented to person, place, and time.     Comments: No facial droop and no weakness of any extremity.  Psychiatric:        Behavior: Behavior normal.      UC Treatments / Results  Labs (all labs ordered are listed, but only abnormal results are displayed) Labs Reviewed - No data to display  EKG   Radiology No results found.  Procedures Procedures (including critical care time)  Medications Ordered in UC Medications - No data to display  Initial Impression / Assessment and Plan / UC Course  I have reviewed the triage vital signs and the nursing notes.  Pertinent labs & imaging results that were available during my care of the patient were reviewed by me and considered in my medical decision making (see chart  for details).        With the sudden onset of vertigo, and her elevated blood pressure, I would like her to present to the emergency room for further  evaluation that we cannot accomplish for her here in the urgent care setting.  She will proceed by private car with her husband driving to the emergency room Final Clinical Impressions(s) / UC Diagnoses   Final diagnoses:  Vertigo     Discharge Instructions      She is going to proceed to the ER with her husband driving     ED Prescriptions   None    PDMP not reviewed this encounter.   Zenia Resides, MD 10/06/22 [phone]

## 2022-10-06 NOTE — ED Notes (Signed)
Patient transported to MRI 

## 2022-10-06 NOTE — ED Notes (Signed)
Patient is being discharged from the Urgent Care and sent to the Emergency Department via POV with family . Per Dr. Marlinda Mike, patient is in need of higher level of care due to sudden unset of dizziness and high BP. Patient is aware and verbalizes understanding of plan of care.  Vitals:   10/06/22 1903  BP: (!) 184/98  Pulse: (!) 53  Resp: 18  Temp: 98.2 F (36.8 C)  SpO2: 98%

## 2022-10-06 NOTE — ED Triage Notes (Signed)
Spanish speaker pt states she was at work and got out at 5 pm for a break she had 2 yogurt and after that she started felling very dizzy and nauseous. Pt is AO x 4 during triage no neuro deficit.

## 2022-10-06 NOTE — Discharge Instructions (Signed)
She is going to proceed to the ER with her husband driving

## 2022-10-06 NOTE — ED Triage Notes (Signed)
Pt states that she had chest pain and jaw issues starting at 5pm. Also endorses some nausea and feeling lightheaded.

## 2022-10-07 MED ORDER — MECLIZINE HCL 25 MG PO TABS: 25.0000 mg | ORAL_TABLET | Freq: Three times a day (TID) | ORAL | 0 refills | Status: AC | PRN

## 2022-10-07 NOTE — Discharge Instructions (Signed)
Your work-up today was reassuring-- no signs of heart attack or stroke. Your blood pressure has been elevated-- make sure to continue your medications and follow-up with your primary care doctor about this. I have placed a referral to cardiology for you-- they should contact you in the next few days for an appt. Can continue the meclizine as needed for dizziness. Return here for any new/acute changes.

## 2022-10-07 NOTE — ED Notes (Signed)
Pt was walked to the restroom and back, pt stated she felt a little dizziness but she is able to walk unassisted.

## 2022-10-07 NOTE — ED Provider Notes (Signed)
  Care signed out pending cardiology consult and ambulatory trial.  Patient reportedly having some chest pain and dizziness, however extensive work-up today is reassuring including EKG, trop x2, CXR, and MRI brain.  She was given meclizine for dizziness, able to ambulate to and from bathroom afterwards without assistance.  Patient's BP is elevated but improved from time of arrival, now 186/77.  She has no findings of end organ damage on laboratory testing.  I never received call back from cardiology, however truly does not seem to be much for them to do emergently given reassuring evaluation.  She is not having any active chest pain presently.  At this point, I do feel it is reasonable to discharge patient.  She did improve with meclizine, question if she has some component of vertigo.  Will prescribe this for home, place ambulatory referral to cardiology to arrange close follow-up.  Can follow-up with primary care doctor in the interim, would likely benefit from tighter blood pressure control.  I did discuss this plan with hospitalist that was initially consulted, they felt this was reasonable.   Garlon Hatchet, PA-C 10/07/22 0153    Molpus, Jonny Ruiz, MD 10/07/22 [phone]

## 2022-10-07 NOTE — ED Provider Notes (Incomplete)
[facility] AT [facility] Provider Note   CSN: [id number] Arrival date & time: 10/06/22  1937     History {Add pertinent medical, surgical, social history, OB history to HPI:1} Chief Complaint  Patient presents with  . Chest Pain    Linda Ferguson is a 77 y.o. female, history of A-fib, who presents to the ED secondary to chest pain that started around 445, as well as dizziness.  States she was at work when the new current amount 4 hours ago, and came on all of a sudden, she had sudden pressure to her chest, nausea and radiation to her left jaw.  Also states that she had acute onset of dizziness at this time and felt like something was buzzing in her left ear.  Denies any kind of vomiting, does endorse some shortness of breath with this.  States she has never had this happen before.  Notes that the pain has been persistent, not going away, she is not taking anything for this pain.  States that she has not fallen or hit her head and denies any recent illnesses/surgeries.  Denies any weakness on one side of the body, or changes in speech    Home Medications Prior to Admission medications   Medication Sig Start Date End Date Taking? Authorizing Provider  amLODipine (NORVASC) 5 MG tablet TAKE 1 TABLET (5 MG TOTAL) BY MOUTH DAILY. 06/03/22   Hoy Register, MD  aspirin EC 81 MG tablet Take 81 mg by mouth daily.    [provider]  Blood Glucose Monitoring Suppl (ONETOUCH VERIO) w/Device KIT 1 each by Does not apply route daily before breakfast. 03/09/22   Hoy Register, MD  cetirizine (ZYRTEC) 10 MG tablet Take 1 tablet (10 mg total) by mouth daily. 11/04/21   Hoy Register, MD  escitalopram (LEXAPRO) 10 MG tablet Take 1 tablet (10 mg total) by mouth daily. 03/09/22   Hoy Register, MD  fluticasone (FLONASE) 50 MCG/ACT nasal spray PLACE 2 SPRAYS INTO BOTH NOSTRILS DAILY. 06/03/22   Hoy Register, MD  glucose blood (ONETOUCH VERIO) test  strip Use as instructed 03/09/22   Hoy Register, MD  hydrOXYzine (ATARAX) 25 MG tablet TAKE 1 TABLET (25 MG TOTAL) BY MOUTH AT BEDTIME AS NEEDED. 06/03/22   Hoy Register, MD  losartan-hydrochlorothiazide (HYZAAR) 100-25 MG tablet Take 1 tablet by mouth daily. 03/09/22   Hoy Register, MD  lovastatin (MEVACOR) 40 MG tablet Take 1 tablet (40 mg total) by mouth at bedtime. 12/16/21   Hoy Register, MD  metFORMIN (GLUCOPHAGE) 500 MG tablet Take 1 tablet (500 mg total) by mouth daily with breakfast. 03/09/22   Hoy Register, MD  OneTouch Delica Lancets 33G MISC 1 each by Does not apply route daily before breakfast. 03/09/22   Hoy Register, MD      Allergies    Allegra-d allergy & congestion [fexofenadine-pseudoephed er]    Review of Systems   Review of Systems  Cardiovascular:  Positive for chest pain.  Neurological:  Positive for dizziness. Negative for headaches.    Physical Exam Updated Vital Signs BP (!) 199/76   Pulse (!) 55   Temp 98.7 F (37.1 C) (Oral)   Resp 14   LMP  (LMP Unknown)   SpO2 99%  Physical Exam Vitals and nursing note reviewed.  Constitutional:      General: She is not in acute distress.    Appearance: She is well-developed.  HENT:     Head: Normocephalic and atraumatic.  Eyes:  Conjunctiva/sclera: Conjunctivae normal.  Cardiovascular:     Rate and Rhythm: Normal rate and regular rhythm.     Heart sounds: No murmur heard. Pulmonary:     Effort: Pulmonary effort is normal. No respiratory distress.     Breath sounds: Normal breath sounds.  Abdominal:     Palpations: Abdomen is soft.     Tenderness: There is no abdominal tenderness.  Musculoskeletal:        General: No swelling.     Cervical back: Neck supple.  Skin:    General: Skin is warm and dry.     Capillary Refill: Capillary refill takes less than 2 seconds.  Neurological:     General: No focal deficit present.     Mental Status: She is alert and oriented to person, place, and  time.     Cranial Nerves: Cranial nerves 2-12 are intact.     Sensory: Sensation is intact.     Motor: Motor function is intact.  Psychiatric:        Mood and Affect: Mood normal.     ED Results / Procedures / Treatments   Labs (all labs ordered are listed, but only abnormal results are displayed) Labs Reviewed  COMPREHENSIVE METABOLIC PANEL - Abnormal; Notable for the following components:      Result Value   Glucose, Bld 156 (*)    BUN 26 (*)    Calcium 8.8 (*)    All other components within normal limits  I-STAT CHEM 8, ED - Abnormal; Notable for the following components:   BUN 28 (*)    Glucose, Bld 113 (*)    Calcium, Ion 1.11 (*)    All other components within normal limits  CBC WITH DIFFERENTIAL/PLATELET  TROPONIN I (HIGH SENSITIVITY)  TROPONIN I (HIGH SENSITIVITY)    EKG EKG Interpretation  Date/Time:  Tuesday October 06 2022 20:24:17 EDT Ventricular Rate:  62 PR Interval:  178 QRS Duration: 87 QT Interval:  445 QTC Calculation: 452 R Axis:   60 Text Interpretation: Sinus rhythm Multiple ventricular premature complexes No significant change since last tracing Confirmed by Melene Plan [phone]) on 10/06/2022 11:10:33 PM  Radiology MR BRAIN WO CONTRAST  Result Date: 10/06/2022 CLINICAL DATA:  Syncope EXAM: MRI HEAD WITHOUT CONTRAST TECHNIQUE: Multiplanar, multiecho pulse sequences of the brain and surrounding structures were obtained without intravenous contrast. COMPARISON:  None Available. FINDINGS: Brain: No acute infarct, mass effect or extra-axial collection. Fewer than 5 scattered microhemorrhages in a nonspecific pattern. There is multifocal hyperintense T2-weighted signal within the white matter. Parenchymal volume and CSF spaces are normal. The midline structures are normal. Vascular: Major flow voids are preserved. Skull and upper cervical spine: Normal calvarium and skull base. Visualized upper cervical spine and soft tissues are normal. Sinuses/Orbits:No  paranasal sinus fluid levels or advanced mucosal thickening. No mastoid or middle ear effusion. Normal orbits. IMPRESSION: 1. No acute intracranial abnormality. 2. 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 Electronically Signed   By: Deatra Robinson M.D.   On: 10/06/2022 23:04   CT Angio Chest/Abd/Pel for Dissection W and/or Wo Contrast  Result Date: 10/06/2022 CLINICAL DATA:  Aortic aneurysm suspected. Sudden onset of vertigo and nausea. EXAM: CT ANGIOGRAPHY CHEST, ABDOMEN AND PELVIS TECHNIQUE: Non-contrast CT of the chest was initially obtained. Multidetector CT imaging through the chest, abdomen and pelvis was performed using the standard protocol during bolus administration of intravenous contrast. Multiplanar reconstructed images and MIPs were obtained and reviewed to evaluate the vascular anatomy. RADIATION DOSE REDUCTION: This  exam was performed according to the departmental dose-optimization program which includes automated exposure control, adjustment of the mA and/or kV according to patient size and/or use of iterative reconstruction technique. CONTRAST:  OMNIPAQUE IOHEXOL 350 MG/ML SOLN COMPARISON:  None Available. FINDINGS: CTA CHEST FINDINGS Cardiovascular: The heart is normal in size and there is a trace pericardial effusion. Scattered coronary artery calcifications are noted. There is atherosclerotic calcification of the aorta without evidence of aneurysm or dissection. The pulmonary trunk is normal in caliber. Mediastinum/Nodes: No mediastinal, hilar, or axillary lymphadenopathy by size criteria. There is a 6 mm hypodensity in the right lobe of the thyroid gland. No additional imaging is recommended. The trachea and esophagus are within normal limits. Lungs/Pleura: Pleural and parenchymal scarring with calcifications is noted at the lung apices bilaterally. Mild atelectasis is present bilaterally. No effusion or pneumothorax. Musculoskeletal: Degenerative changes are present in the thoracic  spine. No acute or suspicious osseous abnormality is seen. Review of the MIP images confirms the above findings. CTA ABDOMEN AND PELVIS FINDINGS VASCULAR Aorta: Normal caliber aorta without aneurysm, dissection, vasculitis or significant stenosis. Aortic atherosclerosis. Celiac: Patent without evidence of aneurysm, dissection, vasculitis or significant stenosis. 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 SMA: Patent without evidence of aneurysm, dissection, vasculitis or significant stenosis. Renals: Both renal arteries are patent without evidence of aneurysm, dissection, vasculitis, fibromuscular dysplasia or significant stenosis. An accessory renal artery is noted on the right. IMA: Patent without evidence of aneurysm, dissection, vasculitis or significant stenosis. Inflow: Patent without evidence of aneurysm, dissection, vasculitis or significant stenosis. Veins: No obvious venous abnormality within the limitations of this arterial phase study. Review of the MIP images confirms the above findings. NON-VASCULAR Hepatobiliary: Stable cysts are present within the liver. No biliary ductal dilatation. The gallbladder is within normal limits. Pancreas: Unremarkable. No pancreatic ductal dilatation or surrounding inflammatory changes. Spleen: Normal in size without focal abnormality. Adrenals/Urinary Tract: The adrenal glands are within normal limits. The kidneys enhance symmetrically. No renal calculus or hydronephrosis. The bladder is unremarkable. Stomach/Bowel: Stomach is within normal limits. Appendix appears normal. No evidence of bowel wall thickening, distention, or inflammatory changes. No free air or pneumatosis. Scattered diverticula are present along the colon without evidence of diverticulitis. Lymphatic: No abdominal or pelvic lymphadenopathy. Reproductive: Uterus and bilateral adnexa are unremarkable. Other:  No abdominopelvic ascites. Musculoskeletal: Degenerative changes are present in the lumbar spine. No acute osseous abnormality. Review of the MIP images confirms the above findings. IMPRESSION: 1. Aortic atherosclerosis without evidence of aneurysm or dissection. 2. Stable rim calcified splenic artery aneurysm measuring 3.0 cm. 3. No acute process in the chest, abdomen, or pelvis. 4. Colonic diverticulosis without diverticulitis. Electronically Signed   By: Thornell Sartorius M.D.   On: 10/06/2022 22:09   CT Head Wo Contrast  Result Date: 10/06/2022 CLINICAL DATA:  Syncope. EXAM: CT HEAD WITHOUT CONTRAST TECHNIQUE: Contiguous axial images were obtained from the base of the skull through the vertex without intravenous contrast. RADIATION DOSE REDUCTION: This exam was performed according to the departmental dose-optimization program which includes automated exposure control, adjustment of the mA and/or kV according to patient size and/or use of iterative reconstruction technique. COMPARISON:  None Available. FINDINGS: Brain: The ventricles and sulci appropriate size for patient's age. The gray-white matter discrimination is preserved. Bilateral basal ganglia calcification as well as areas of calcification in the cerebellum. There is no acute intracranial hemorrhage.  No mass effect or midline shift. No extra-axial fluid collection. Vascular: No hyperdense vessel or unexpected calcification. Skull: Normal. Negative for fracture or focal lesion. Sinuses/Orbits: No acute finding. Other: None IMPRESSION: No acute intracranial pathology. Electronically Signed   By: Elgie Collard M.D.   On: 10/06/2022 21:59   DG Chest 2 View  Result Date: 10/06/2022 CLINICAL DATA:  Sudden onset chest pain, initial encounter EXAM: CHEST - 2 VIEW COMPARISON:  06/14/2021 FINDINGS: Cardiac shadow is within normal limits. The lungs are well aerated bilaterally. No focal infiltrate or effusion is seen. No bony abnormality is noted. Calcified  celiac artery aneurysm is noted. IMPRESSION: No acute abnormality noted. Electronically Signed   By: Alcide Clever M.D.   On: 10/06/2022 20:28    Procedures Procedures  {Document cardiac monitor, telemetry assessment procedure when appropriate:1}  Medications Ordered in ED Medications  nitroGLYCERIN (NITROSTAT) SL tablet 0.4 mg (0.4 mg Sublingual Given 10/06/22 2327)  iohexol (OMNIPAQUE) 350 MG/ML injection 100 mL (100 mLs Intravenous Contrast Given 10/06/22 2140)  meclizine (ANTIVERT) tablet 25 mg (25 mg Oral Given 10/06/22 2346)    ED Course/ Medical Decision Making/ A&P   {   Click here for ABCD2, HEART and other calculatorsREFRESH Note before signing :1}                          Medical Decision Making Patient is a 77 year old female, here for chest pain, and dizziness that started all of a sudden, around 4:45 PM today.  She states she is dizzy and has pressurized chest pain.  I discussed with my attending Dr. Adela Lank, we will obtain an MRI of her brain, CT head, as well as CTA abdomen pelvis, for rule out of dissection given her neurologic symptoms, and hypertensive status.  She has been compliant with her blood pressure medications.  Additionally we will give nitroglycerin, to help and see if there is any kind of improvement with her chest pain.  Amount and/or Complexity of Data Reviewed Labs: ordered.    Details: Troponin x 2 within normal limits Radiology: ordered.    Details: CTA chest, shows atherosclerosis, no evidence of dissection.  Right she has no evidence of any kind of stroke but Angelise Petrich vessel vascular disease Discussion of management or test interpretation with external provider(s): Discussed with attending Dr. Adela Lank, he recommends admission given patient's persistent dizziness and chest pain.  Heart score of  Risk Prescription drug management. Decision regarding hospitalization.    Final Clinical Impression(s) / ED Diagnoses Final diagnoses:  Chest pain, unspecified  type  Dizziness    Rx / DC Orders ED Discharge Orders     None

## 2022-12-11 NOTE — Progress Notes (Unsigned)
Cardiology Office Note:   Date:  12/14/2022  ID:  Linda Ferguson, DOB 1945-12-31, MRN [id number] PCP:  Patient, No Pcp Per  [facility] Providers Cardiologist:  Alverda Skeans, MD Referring MD: Garlon Hatchet, PA-C   Chief Complaint/Reason for Referral: ER follow-up for chest pain ASSESSMENT:    1. Precordial pain   2. Type 2 diabetes mellitus without complication, without long-term current use of insulin (HCC)   3. Hypertension associated with diabetes (HCC)   4. Hyperlipidemia associated with type 2 diabetes mellitus (HCC)   5. Aortic atherosclerosis (HCC)   6. Coronary artery calcification seen on CAT scan   7. BMI 26.0-26.9,adult   8. Nonspecific abnormal electrocardiogram (ECG) (EKG)   9. Medication management     PLAN:   In order of problems listed above: 1.  Chest pain: Likely due to uncontrolled hypertension.  Her blood pressure is well-controlled today.  We will refrain from coronary CTA and/or TTE evaluation today.  Continue current therapy.   2.  Type 2 diabetes: Continue aspirin, Hyzaar, lovastatin, and can consider Jardiance in the future; check HbA1c. 3.  Hypertension: Blood pressure is well-controlled today 4.  Hyperlipidemia: Will check lipid panel, LFTs, and LP(a) today. 5.  Aortic atherosclerosis: Continue aspirin, statin, and strict blood pressure control. 6.  Coronary artery calcification: See discussion #5 above. 7.  Elevated BMI: Diet and exercise for now; not sure she is a diabetic; can consider GLP1 in the future              Dispo:  Return in about 9 months (around 09/13/2023).      Medication Adjustments/Labs and Tests Ordered: Current medicines are reviewed at length with the patient today.  Concerns regarding medicines are outlined above.  The following changes have been made:  no change   Labs/tests ordered: Orders Placed This Encounter  Procedures   Lipoprotein A (LPA)   Lipid panel   Comprehensive metabolic panel    Hemoglobin A1c   EKG 12-Lead    Medication Changes: No orders of the defined types were placed in this encounter.   Current medicines are reviewed at length with the patient today.  The patient does not have concerns regarding medicines.  History of Present Illness:   FOCUSED PROBLEM LIST:   Type 2 diabetes, diet controlled Hypertension Hyperlipidemia BMI of 26 Aortic atherosclerosis and coronary calcification on chest CT 2024  The patient is a 77 y.o. female with the indicated medical history here for emergency room follow-up for chest pain.  The patient was seen in the emergency room for chest pain in June of this year.  Her initial blood pressure was 184/98.  Her evaluation in the emergency department was notable for a chest CT that was negative for PE or dissection but did demonstrate aortic atherosclerosis and coronary artery calcification.  Her troponins were negative x 2.  She is here for emergency room follow-up.  She is here with her husband.  She works Doctor, general practice houses.  She has had no exertional angina or exertional dyspnea.  She does get chest discomfort when her blood pressure is high she has noticed.  She denies any severe bleeding or bruising, paroxysmal atrial dyspnea, orthopnea.  She is required no emergency room visits or hospitalizations since her emergency room presentation in June.  She feels well and is otherwise without complaints.  She does not smoke or drink.       Current Medications: Current Meds  Medication Sig   aspirin EC 81  MG tablet Take 81 mg by mouth daily.   famotidine (PEPCID) 20 MG tablet Take 20 mg by mouth daily.   fluticasone (FLONASE) 50 MCG/ACT nasal spray PLACE 2 SPRAYS INTO BOTH NOSTRILS DAILY.   glucose blood (ONETOUCH VERIO) test strip Use as instructed   hydrOXYzine (ATARAX) 25 MG tablet TAKE 1 TABLET (25 MG TOTAL) BY MOUTH AT BEDTIME AS NEEDED.   losartan-hydrochlorothiazide (HYZAAR) 100-25 MG tablet Take 1 tablet by mouth  daily.   OneTouch Delica Lancets 33G MISC 1 each by Does not apply route daily before breakfast.   pantoprazole (PROTONIX) 40 MG tablet Take 40 mg by mouth daily.   rosuvastatin (CRESTOR) 40 MG tablet Take 40 mg by mouth daily.     Allergies:    Allegra-d allergy & congestion [fexofenadine-pseudoephed er]   Social History:   Social History   Tobacco Use   Smoking status: Never   Smokeless tobacco: Never  Vaping Use   Vaping status: Never Used  Substance Use Topics   Alcohol use: No   Drug use: No     Family Hx: History reviewed. No pertinent family history.   Review of Systems:   Please see the history of present illness.    All other systems reviewed and are negative.     EKGs/Labs/Other Test Reviewed:   EKG:   EKG Interpretation Date/Time:  Monday December 14 2022 08:19:06 EDT Ventricular Rate:  55 PR Interval:  162 QRS Duration:  84 QT Interval:  434 QTC Calculation: 415 R Axis:   68  Text Interpretation: Sinus bradycardia When compared with ECG of 06-Oct-2022 20:24, PREVIOUS ECG IS PRESENT Confirmed by Alverda Skeans ([id number]) on 12/14/2022 8:20:11 AM        Prior CV studies reviewed:  None available  Recent Labs: 10/06/2022: ALT 16; BUN 28; Creatinine, Ser 0.90; Hemoglobin 13.3; Platelets 260; Potassium 4.1; Sodium 135   Lipid Panel    Component Value Date/Time   CHOL 187 03/09/2022 1034   TRIG 172 (H) 03/09/2022 1034   HDL 63 03/09/2022 1034   CHOLHDL 4.3 Ratio 11/25/2007 0000   VLDL 46 (H) 11/25/2007 0000   LDLCALC 95 03/09/2022 1034   LDLDIRECT 118.9 11/11/2006 1144    Risk Assessment/Calculations:          Physical Exam:   VS:  BP 124/78   Pulse 66   Ht 5\' 5"  (1.651 m)   Wt 153 lb 3.2 oz (69.5 kg)   LMP  (LMP Unknown)   SpO2 98%   BMI 25.49 kg/m        Wt Readings from Last 3 Encounters:  12/14/22 153 lb 3.2 oz (69.5 kg)  10/06/22 158 lb 11.7 oz (72 kg)  03/09/22 158 lb 9.6 oz (71.9 kg)      GENERAL:  No apparent distress,  AOx3 HEENT:  No carotid bruits, +2 carotid impulses, no scleral icterus CAR: RRR no murmurs, gallops, rubs, or thrills RES:  Clear to auscultation bilaterally ABD:  Soft, nontender, nondistended, positive bowel sounds x 4 VASC:  +2 radial pulses, +2 carotid pulses NEURO:  CN 2-12 grossly intact; motor and sensory grossly intact PSYCH:  No active depression or anxiety EXT:  No edema, ecchymosis, or cyanosis  Signed, Orbie Pyo, MD  12/14/2022 8:37 AM    [facility] [address] [place], [place], [place]  [address] Phone: [phone]; Fax: [phone]   Note:  This document was prepared using Dragon voice recognition software and may include unintentional  dictation errors.

## 2022-12-14 ENCOUNTER — Encounter: Payer: Self-pay | Admitting: Internal Medicine

## 2022-12-14 ENCOUNTER — Ambulatory Visit: Payer: No Typology Code available for payment source | Attending: Internal Medicine | Admitting: Internal Medicine

## 2022-12-14 VITALS — BP 124/78 | HR 66 | Ht 65.0 in | Wt 153.2 lb

## 2022-12-14 DIAGNOSIS — Z79899 Other long term (current) drug therapy: Secondary | ICD-10-CM

## 2022-12-14 DIAGNOSIS — E785 Hyperlipidemia, unspecified: Secondary | ICD-10-CM

## 2022-12-14 DIAGNOSIS — E1169 Type 2 diabetes mellitus with other specified complication: Secondary | ICD-10-CM | POA: Diagnosis not present

## 2022-12-14 DIAGNOSIS — R9431 Abnormal electrocardiogram [ECG] [EKG]: Secondary | ICD-10-CM

## 2022-12-14 DIAGNOSIS — E1159 Type 2 diabetes mellitus with other circulatory complications: Secondary | ICD-10-CM | POA: Diagnosis not present

## 2022-12-14 DIAGNOSIS — I2510 Atherosclerotic heart disease of native coronary artery without angina pectoris: Secondary | ICD-10-CM | POA: Diagnosis not present

## 2022-12-14 DIAGNOSIS — E119 Type 2 diabetes mellitus without complications: Secondary | ICD-10-CM

## 2022-12-14 DIAGNOSIS — Z6826 Body mass index (BMI) 26.0-26.9, adult: Secondary | ICD-10-CM

## 2022-12-14 DIAGNOSIS — Z7984 Long term (current) use of oral hypoglycemic drugs: Secondary | ICD-10-CM

## 2022-12-14 DIAGNOSIS — R072 Precordial pain: Secondary | ICD-10-CM | POA: Diagnosis not present

## 2022-12-14 DIAGNOSIS — I152 Hypertension secondary to endocrine disorders: Secondary | ICD-10-CM

## 2022-12-14 DIAGNOSIS — I700 Atherosclerosis of aorta: Secondary | ICD-10-CM

## 2022-12-14 NOTE — Patient Instructions (Signed)
Medication Instructions:  The current medical regimen is effective;  continue present plan and medications.  *If you need a refill on your cardiac medications before your next appointment, please call your pharmacy*   Lab Work: Please have blood work today (Lipid, LPa, CMP, HA1C)  If you have labs (blood work) drawn today and your tests are completely normal, you will receive your results only by: MyChart Message (if you have MyChart) OR A paper copy in the mail If you have any lab test that is abnormal or we need to change your treatment, we will call you to review the results.  Follow-Up: At [facility], you and your health needs are our priority.  As part of our continuing mission to provide you with exceptional heart care, we have created designated Provider Care Teams.  These Care Teams include your primary Cardiologist (physician) and Advanced Practice Providers (APPs -  Physician Assistants and Nurse Practitioners) who all work together to provide you with the care you need, when you need it.  We recommend signing up for the patient portal called "MyChart".  Sign up information is provided on this After Visit Summary.  MyChart is used to connect with patients for Virtual Visits (Telemedicine).  Patients are able to view lab/test results, encounter notes, upcoming appointments, etc.  Non-urgent messages can be sent to your provider as well.   To learn more about what you can do with MyChart, go to [email or web address].    Your next appointment:   9 month(s)  Provider:   Jari Favre, PA-C, Ronie Spies, PA-C, Robin Searing, NP, Jacolyn Reedy, PA-C, Eligha Bridegroom, NP, or Tereso Newcomer, PA-C

## 2022-12-15 LAB — COMPREHENSIVE METABOLIC PANEL
ALT: 16 IU/L (ref 0–32)
AST: 21 IU/L (ref 0–40)
Albumin: 4.6 g/dL (ref 3.8–4.8)
Alkaline Phosphatase: 112 IU/L (ref 44–121)
BUN/Creatinine Ratio: 14 (ref 12–28)
BUN: 14 mg/dL (ref 8–27)
Bilirubin Total: 0.6 mg/dL (ref 0.0–1.2)
CO2: 24 mmol/L (ref 20–29)
Calcium: 9.3 mg/dL (ref 8.7–10.3)
Chloride: 107 mmol/L — ABNORMAL HIGH (ref 96–106)
Creatinine, Ser: 1.03 mg/dL — ABNORMAL HIGH (ref 0.57–1.00)
Globulin, Total: 2.4 g/dL (ref 1.5–4.5)
Glucose: 115 mg/dL — ABNORMAL HIGH (ref 70–99)
Potassium: 4.3 mmol/L (ref 3.5–5.2)
Sodium: 141 mmol/L (ref 134–144)
Total Protein: 7 g/dL (ref 6.0–8.5)
eGFR: 56 mL/min/{1.73_m2} — ABNORMAL LOW (ref >59)

## 2022-12-15 LAB — LIPID PANEL
Chol/HDL Ratio: 2.5 ratio (ref 0.0–4.4)
Cholesterol, Total: 171 mg/dL (ref 100–199)
HDL: 68 mg/dL (ref >39)
LDL Chol Calc (NIH): 66 mg/dL (ref 0–99)
Triglycerides: 230 mg/dL — ABNORMAL HIGH (ref 0–149)
VLDL Cholesterol Cal: 37 mg/dL (ref 5–40)

## 2022-12-15 LAB — LIPOPROTEIN A (LPA): Lipoprotein (a): 29 nmol/L (ref <75.0)

## 2022-12-15 LAB — HEMOGLOBIN A1C
Est. average glucose Bld gHb Est-mCnc: 137 mg/dL
Hgb A1c MFr Bld: 6.4 % — ABNORMAL HIGH (ref 4.8–5.6)

## 2022-12-16 ENCOUNTER — Telehealth: Payer: Self-pay

## 2022-12-16 NOTE — Telephone Encounter (Signed)
Spoke with patient and she is aware of lab results and provider recommendations. She verbalized understanding

## 2022-12-16 NOTE — Telephone Encounter (Signed)
-----   Message from Orbie Pyo sent at 12/15/2022  4:12 PM EDT ----- Let her know that her LDL is good.  She is a borderline diabetic based on the blood test.  She should talk to her PCP about this.

## 2023-01-18 IMAGING — DX DG CHEST 2V
2 series · 2 of 2 positions shown · non-contrast
Comparison: CT chest 12/22/1999, CT 5868

CLINICAL DATA: Cough for 5 days

EXAM:
CHEST - 2 VIEW

[chest pa]
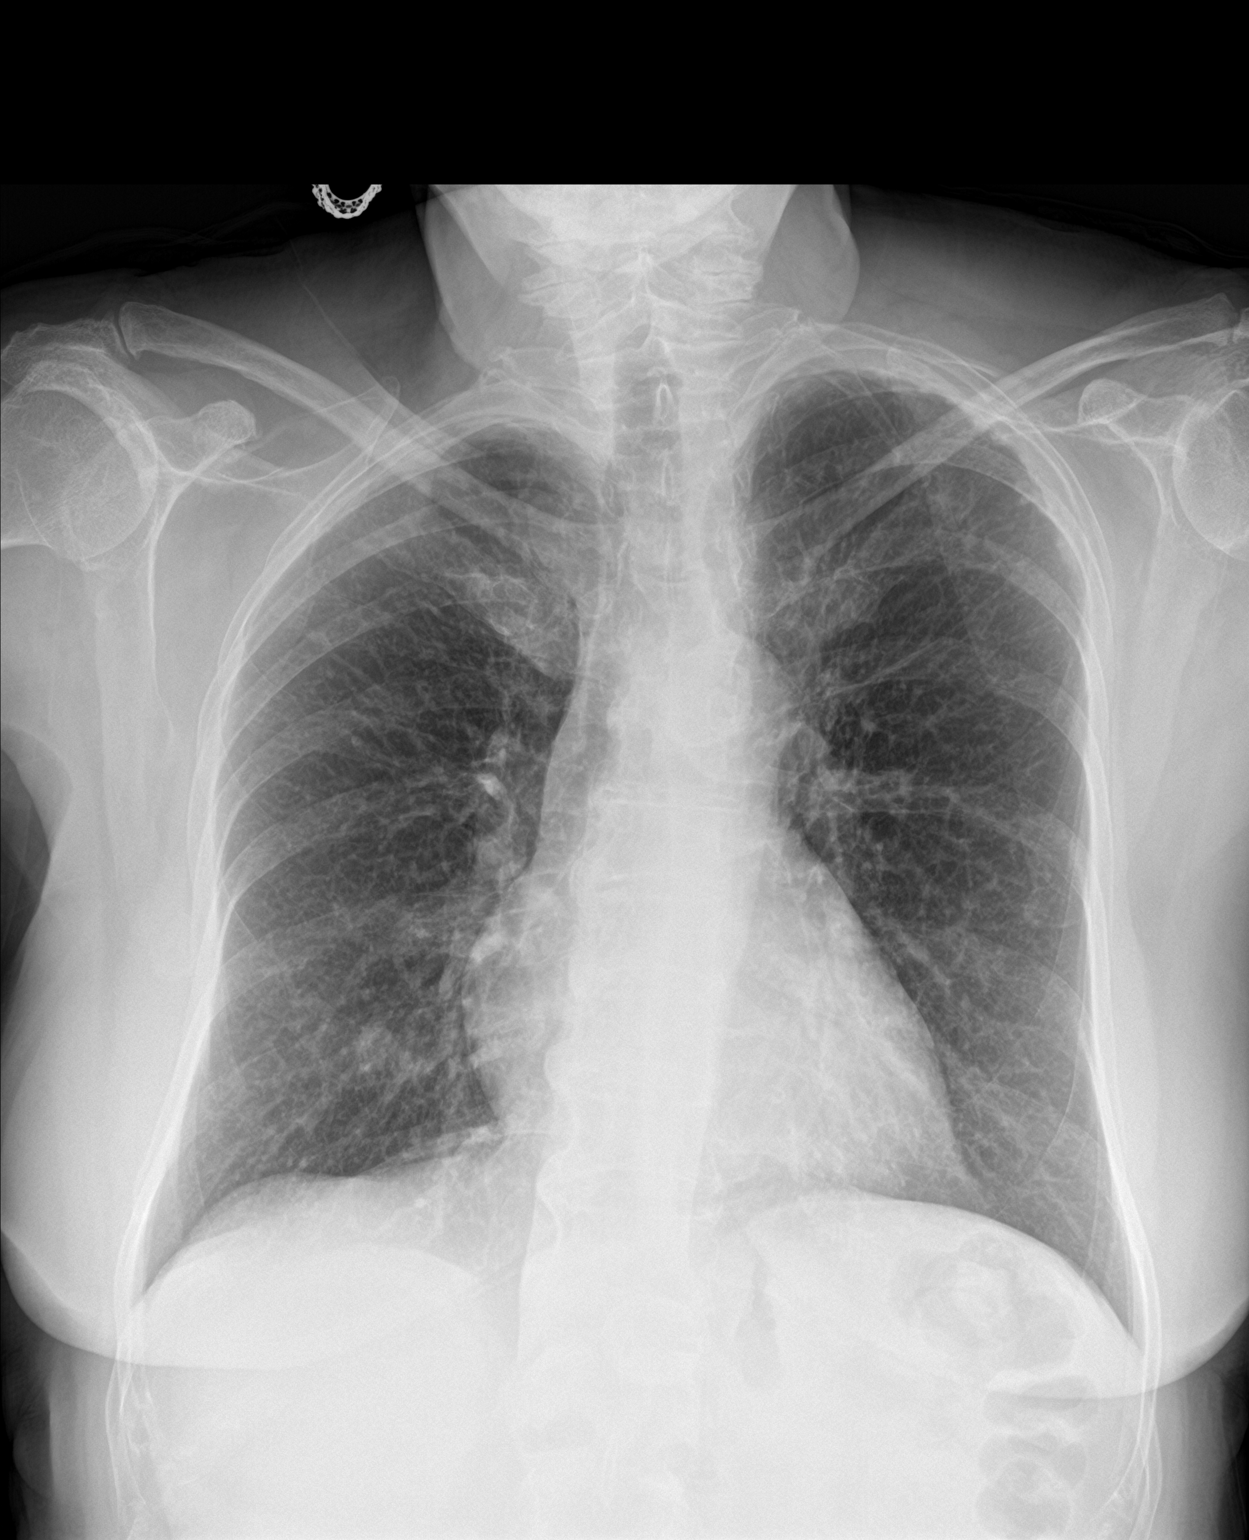

[chest lat]
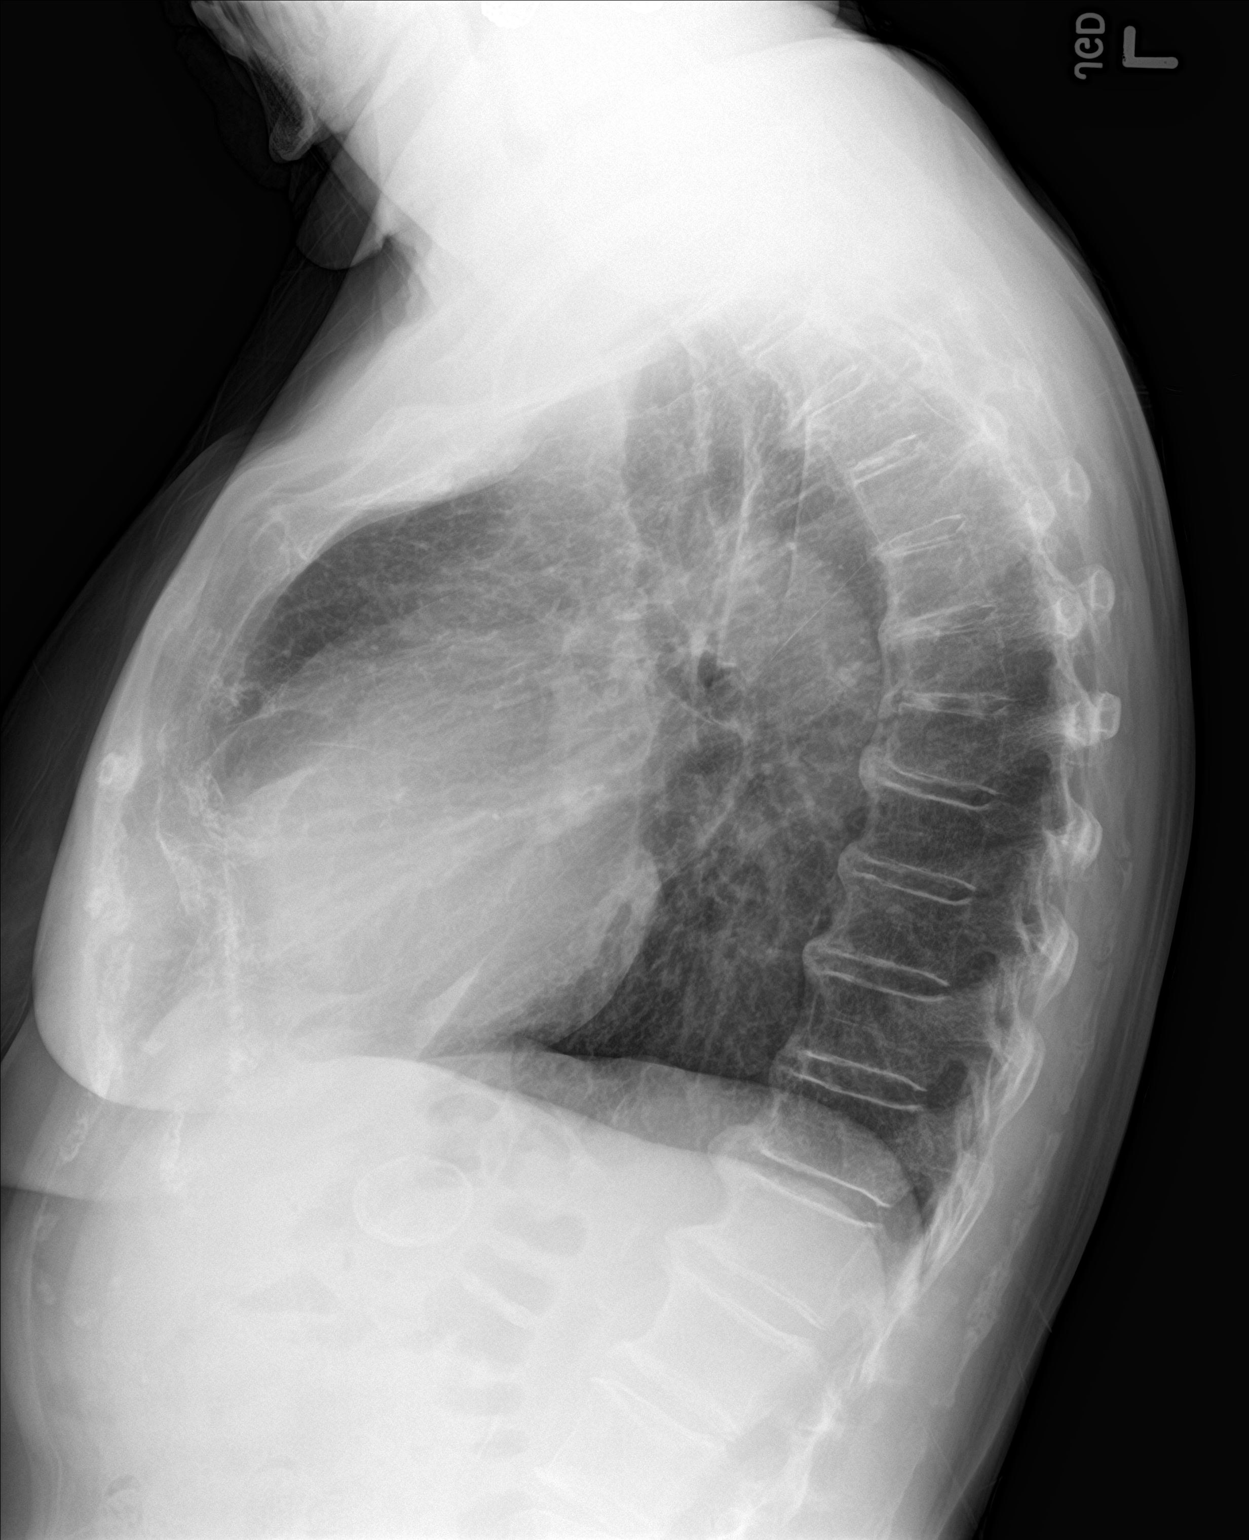

[2 of 2 positions shown; findings below may reference images not displayed]

FINDINGS: Normal cardiac silhouette. Lungs are mildly hyperinflated. No
effusion, infiltrate or pneumothorax. 12 mm nodule projects over the
RIGHT lower lobe.

Calcified splenic artery aneurysm measuring 2.7 cm over the central
mid abdomen unchanged from comparison CT 5868.
IMPRESSION: 1. No acute cardiopulmonary findings.
2. Potential RIGHT lower lobe nodule. Recommend CT of the thorax
without contrast for further characterization.

## 2023-05-28 ENCOUNTER — Emergency Department (HOSPITAL_COMMUNITY)
Admission: EM | Admit: 2023-05-28 | Discharge: 2023-05-28 | Disposition: A | Discharge status: Home / Self Care | Payer: No Typology Code available for payment source | Attending: Emergency Medicine | Admitting: Emergency Medicine

## 2023-05-28 ENCOUNTER — Emergency Department (HOSPITAL_COMMUNITY): Payer: No Typology Code available for payment source

## 2023-05-28 ENCOUNTER — Other Ambulatory Visit: Payer: Self-pay

## 2023-05-28 ENCOUNTER — Encounter (HOSPITAL_COMMUNITY): Payer: Self-pay

## 2023-05-28 DIAGNOSIS — R310 Gross hematuria: Secondary | ICD-10-CM | POA: Diagnosis not present

## 2023-05-28 DIAGNOSIS — Z7982 Long term (current) use of aspirin: Secondary | ICD-10-CM | POA: Insufficient documentation

## 2023-05-28 DIAGNOSIS — M545 Low back pain, unspecified: Secondary | ICD-10-CM | POA: Diagnosis not present

## 2023-05-28 DIAGNOSIS — I728 Aneurysm of other specified arteries: Secondary | ICD-10-CM

## 2023-05-28 DIAGNOSIS — R109 Unspecified abdominal pain: Secondary | ICD-10-CM | POA: Diagnosis present

## 2023-05-28 LAB — CBC
HCT: 36.6 % (ref 36.0–46.0)
Hemoglobin: 11.9 g/dL — ABNORMAL LOW (ref 12.0–15.0)
MCH: 30.7 pg (ref 26.0–34.0)
MCHC: 32.5 g/dL (ref 30.0–36.0)
MCV: 94.6 fL (ref 80.0–100.0)
Platelets: 288 10*3/uL (ref 150–400)
RBC: 3.87 MIL/uL (ref 3.87–5.11)
RDW: 13.9 % (ref 11.5–15.5)
WBC: 7.7 10*3/uL (ref 4.0–10.5)
nRBC: 0 % (ref 0.0–0.2)

## 2023-05-28 LAB — URINALYSIS, ROUTINE W REFLEX MICROSCOPIC
Bacteria, UA: NONE SEEN
Bilirubin Urine: NEGATIVE
Glucose, UA: NEGATIVE mg/dL
Ketones, ur: NEGATIVE mg/dL
Leukocytes,Ua: NEGATIVE
Nitrite: NEGATIVE
Protein, ur: NEGATIVE mg/dL
Specific Gravity, Urine: 1.002 — ABNORMAL LOW (ref 1.005–1.030)
pH: 5 (ref 5.0–8.0)

## 2023-05-28 LAB — BASIC METABOLIC PANEL
Anion gap: 12 (ref 5–15)
BUN: 27 mg/dL — ABNORMAL HIGH (ref 8–23)
CO2: 18 mmol/L — ABNORMAL LOW (ref 22–32)
Calcium: 8.8 mg/dL — ABNORMAL LOW (ref 8.9–10.3)
Chloride: 105 mmol/L (ref 98–111)
Creatinine, Ser: 0.99 mg/dL (ref 0.44–1.00)
GFR, Estimated: 58 mL/min — ABNORMAL LOW (ref >60)
Glucose, Bld: 85 mg/dL (ref 70–99)
Potassium: 4.8 mmol/L (ref 3.5–5.1)
Sodium: 135 mmol/L (ref 135–145)

## 2023-05-28 MED ORDER — MELOXICAM 7.5 MG PO TABS: 7.5000 mg | ORAL_TABLET | Freq: Every day | ORAL | 0 refills | Status: AC

## 2023-05-28 MED ORDER — IOHEXOL 300 MG/ML  SOLN
100.0000 mL | Freq: Once | INTRAMUSCULAR | Status: AC | PRN
  Administered 2023-05-28: 100 mL via INTRAVENOUS

## 2023-05-28 NOTE — ED Provider Notes (Signed)
 [place] [facility] AT [facility] Provider Note   CSN: [id number] Arrival date & time: 05/28/23  1554     History  Chief Complaint  Patient presents with   Flank Pain    Linda Ferguson is a 78 y.o. female.  Patient is a 78 year old who presents with back and flank pain.  She reports pain in her bilateral mid back area.  She does not suggest that it is radiating down her legs although she does have some pain in her legs.  She denies any numbness or weakness in the legs.  No recent injury.  No associated abdominal pain although she has had some urinary frequency and she noticed some blood when she wipes earlier today.  She denies any known fevers.  No nausea or vomiting.  No vaginal bleeding or discharge.  She does have a prior history of back issues per her report.  She says she has a herniated disc.  She says the pain is somewhat worse with movement although she does like it is her kidneys and feels like she has swelling around her kidneys.  No chest pain or shortness of breath.  No loss of bowel or bladder control.  History was obtained through video language interpreter.       Home Medications Prior to Admission medications   Medication Sig Start Date End Date Taking? Authorizing Provider  meloxicam  (MOBIC ) 7.5 MG tablet Take 1 tablet (7.5 mg total) by mouth daily. 05/28/23  Yes Lenor Hollering, MD  amLODipine  (NORVASC ) 5 MG tablet TAKE 1 TABLET (5 MG TOTAL) BY MOUTH DAILY. Patient not taking: Reported on 12/14/2022 06/03/22   Newlin, Enobong, MD  aspirin EC 81 MG tablet Take 81 mg by mouth daily.    [provider]  Blood Glucose Monitoring Suppl (ONETOUCH VERIO) w/Device KIT 1 each by Does not apply route daily before breakfast. Patient not taking: Reported on 12/14/2022 03/09/22   Newlin, Enobong, MD  cetirizine  (ZYRTEC ) 10 MG tablet Take 1 tablet (10 mg total) by mouth daily. Patient not taking: Reported on 12/14/2022 11/04/21   Newlin, Enobong,  MD  escitalopram  (LEXAPRO ) 10 MG tablet Take 1 tablet (10 mg total) by mouth daily. Patient not taking: Reported on 12/14/2022 03/09/22   Newlin, Enobong, MD  famotidine (PEPCID) 20 MG tablet Take 20 mg by mouth daily. 11/06/22   [provider]  fluticasone  (FLONASE ) 50 MCG/ACT nasal spray PLACE 2 SPRAYS INTO BOTH NOSTRILS DAILY. 06/03/22   Newlin, Enobong, MD  glucose blood (ONETOUCH VERIO) test strip Use as instructed 03/09/22   Newlin, Enobong, MD  hydrOXYzine  (ATARAX ) 25 MG tablet TAKE 1 TABLET (25 MG TOTAL) BY MOUTH AT BEDTIME AS NEEDED. 06/03/22   Delbert Clam, MD  losartan -hydrochlorothiazide (HYZAAR) 100-25 MG tablet Take 1 tablet by mouth daily. 03/09/22   Newlin, Enobong, MD  lovastatin  (MEVACOR ) 40 MG tablet Take 1 tablet (40 mg total) by mouth at bedtime. Patient not taking: Reported on 12/14/2022 12/16/21   Newlin, Enobong, MD  meclizine  (ANTIVERT ) 25 MG tablet Take 1 tablet (25 mg total) by mouth 3 (three) times daily as needed for dizziness. Patient not taking: Reported on 12/14/2022 10/07/22   Jarold Olam HERO, PA-C  metFORMIN  (GLUCOPHAGE ) 500 MG tablet Take 1 tablet (500 mg total) by mouth daily with breakfast. Patient not taking: Reported on 12/14/2022 03/09/22   Newlin, Enobong, MD  OneTouch Delica Lancets 33G MISC 1 each by Does not apply route daily before breakfast. 03/09/22   Newlin, Enobong, MD  pantoprazole  (PROTONIX ) 40 MG tablet Take 40 mg by mouth daily. 09/17/22   [provider]  rosuvastatin (CRESTOR) 40 MG tablet Take 40 mg by mouth daily. 09/30/22   [provider]      Allergies    Allegra-d allergy & congestion [fexofenadine-pseudoephed er]    Review of Systems   Review of Systems  Constitutional:  Negative for chills, diaphoresis, fatigue and fever.  HENT:  Negative for congestion, rhinorrhea and sneezing.   Eyes: Negative.   Respiratory:  Negative for cough, chest tightness and shortness of breath.   Cardiovascular:  Negative for  chest pain and leg swelling.  Gastrointestinal:  Negative for abdominal pain, blood in stool, diarrhea, nausea and vomiting.  Genitourinary:  Positive for flank pain and hematuria. Negative for difficulty urinating, dysuria and frequency.  Musculoskeletal:  Positive for back pain. Negative for arthralgias.  Skin:  Negative for rash.  Neurological:  Negative for dizziness, speech difficulty, weakness, numbness and headaches.    Physical Exam Updated Vital Signs BP (!) 162/68   Pulse 62   Temp 98 F (36.7 C) (Oral)   Resp 18   Ht 5' 5 (1.651 m)   Wt 70 kg   LMP  (LMP Unknown)   SpO2 98%   BMI 25.68 kg/m  Physical Exam Constitutional:      Appearance: She is well-developed.  HENT:     Head: Normocephalic and atraumatic.  Eyes:     Pupils: Pupils are equal, round, and reactive to light.  Cardiovascular:     Rate and Rhythm: Normal rate and regular rhythm.     Heart sounds: Normal heart sounds.  Pulmonary:     Effort: Pulmonary effort is normal. No respiratory distress.     Breath sounds: Normal breath sounds. No wheezing or rales.  Chest:     Chest wall: No tenderness.  Abdominal:     General: Bowel sounds are normal.     Palpations: Abdomen is soft.     Tenderness: There is no abdominal tenderness. There is no guarding or rebound.  Musculoskeletal:        General: Normal range of motion.     Cervical back: Normal range of motion and neck supple.     Comments: Trace edema around her ankles bilaterally, no pitting edema, normal coloration, pedal pulses intact, no calf tenderness.  Negative straight leg raise bilaterally  Lymphadenopathy:     Cervical: No cervical adenopathy.  Skin:    General: Skin is warm and dry.     Findings: No rash.  Neurological:     Mental Status: She is alert and oriented to person, place, and time.     Comments: Motor 5 out of 5 all extremities, sensation grossly intact to light touch all extremities, gait normal     ED Results / Procedures  / Treatments   Labs (all labs ordered are listed, but only abnormal results are displayed) Labs Reviewed  URINALYSIS, ROUTINE W REFLEX MICROSCOPIC - Abnormal; Notable for the following components:      Result Value   Color, Urine COLORLESS (*)    Specific Gravity, Urine 1.002 (*)    Hgb urine dipstick SMALL (*)    All other components within normal limits  BASIC METABOLIC PANEL - Abnormal; Notable for the following components:   CO2 18 (*)    BUN 27 (*)    Calcium 8.8 (*)    GFR, Estimated 58 (*)    All other components within normal limits  CBC - Abnormal; Notable for the following components:   Hemoglobin 11.9 (*)    All other components within normal limits    EKG None  Radiology CT ABDOMEN PELVIS W CONTRAST Result Date: 05/28/2023 CLINICAL DATA:  Abdominal/flank pain EXAM: CT ABDOMEN AND PELVIS WITH CONTRAST TECHNIQUE: Multidetector CT imaging of the abdomen and pelvis was performed using the standard protocol following bolus administration of intravenous contrast. RADIATION DOSE REDUCTION: This exam was performed according to the departmental dose-optimization program which includes automated exposure control, adjustment of the mA and/or kV according to patient size and/or use of iterative reconstruction technique. CONTRAST:  OMNIPAQUE  IOHEXOL  300 MG/ML  SOLN COMPARISON:  CT scan 10/06/2022 FINDINGS: Lower chest: Circumflex coronary artery and thoracic aortic atherosclerotic vascular calcifications. Hepatobiliary: Stable hypodense hepatic lesions favoring cysts are present. No further imaging workup of these lesions is indicated. Contracted gallbladder.  No biliary dilatation. Pancreas: Unremarkable Spleen: Unremarkable Adrenals/Urinary Tract: Unremarkable Stomach/Bowel: Sigmoid colon diverticulosis. Vascular/Lymphatic: 3.3 by 2.8 cm splenic artery aneurysm on image 19 series 2 with mural thrombus observed, this previously measured the same on 10/05/2012. Atherosclerosis is  present, including aortoiliac atherosclerotic disease. Atheromatous plaque noted dorsally at the origin of the celiac trunk and extending in the dorsal margin of the superior mesenteric artery, without overt occlusion of either vessel. No pathologic adenopathy. Reproductive: Unremarkable Other: No supplemental non-categorized findings. Musculoskeletal: Thoracolumbar spondylosis noted. Mild to moderate degenerative hip arthropathy bilaterally. IMPRESSION: 1. No acute findings are identified to explain the patient's abdominal pain. 2. Sigmoid colon diverticulosis. 3. 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 Consider interventional radiology consultation. 4. Aortic and coronary artery atherosclerosis. 5. Thoracolumbar spondylosis. 6. Mild to moderate degenerative hip arthropathy bilaterally. Electronically Signed   By: Ryan Salvage M.D.   On: 05/28/2023 19:55    Procedures Procedures    Medications Ordered in ED Medications  iohexol  (OMNIPAQUE ) 300 MG/ML solution 100 mL (100 mLs Intravenous Contrast Given 05/28/23 1921)    ED Course/ Medical Decision Making/ A&P                                 Medical Decision Making Amount and/or Complexity of Data Reviewed Labs: ordered. Radiology: ordered.  Risk Prescription drug management.   Patient is a 78 year old who presents with back pain.  It seems to be somewhat musculoskeletal.  It is worse with movement.  She does not have any obvious radicular symptoms or neurologic deficits.  No signs of cauda equina.  She did report some blood that she thought was coming from her urine.  Her urinalysis did not show any signs of infection or hematuria.  CT scan was performed which did not show any kidney stones.  No mass on her kidney or abscess.  No other acute abnormality.  There was a splenic artery aneurysm.  I discussed this with the radiologist and he felt that it looked similar to her  prior scans.  I did discuss with her and she did not know that she had the aneurysm.  Will refer her to have routine follow-ups with vascular surgery.  Regarding her back pain, I suspect is musculoskeletal.  Given the reported blood when she was wiping, I did discuss with her doing a pelvic exam to make sure she was not having vaginal bleeding which would be concerning at her age.  She initially was amenable to this but  then decided that she was ready to leave and refused it.  Encouraged her to follow-up with her primary care doctor regarding this.  Will put her on a short course of Mobic .  Labs reviewed.  Her kidney function was normal.  She was discharged home in good condition.  Was encouraged to follow-up with both her primary care doctor and a vascular surgeon.  Return precautions were given.  Final Clinical Impression(s) / ED Diagnoses Final diagnoses:  Acute bilateral low back pain without sciatica  Gross hematuria  Splenic artery aneurysm (HCC)    Rx / DC Orders ED Discharge Orders          Ordered    meloxicam  (MOBIC ) 7.5 MG tablet  Daily        05/28/23 2151              Lenor Hollering, MD 05/28/23 2158

## 2023-05-28 NOTE — ED Triage Notes (Signed)
 Patient said her feet are swelling and her kidneys are hurting. Bilateral flank pain for 2 weeks. When she has wiped her vagina she had blood on the toilet paper over the last 2 days.

## 2023-07-12 ENCOUNTER — Encounter (INDEPENDENT_AMBULATORY_CARE_PROVIDER_SITE_OTHER): Payer: Self-pay | Admitting: Vascular Surgery

## 2023-07-12 ENCOUNTER — Ambulatory Visit (INDEPENDENT_AMBULATORY_CARE_PROVIDER_SITE_OTHER): Admitting: Vascular Surgery

## 2023-07-12 VITALS — BP 157/65 | HR 54 | Resp 18 | Ht 64.5 in | Wt 155.4 lb

## 2023-07-12 DIAGNOSIS — E1169 Type 2 diabetes mellitus with other specified complication: Secondary | ICD-10-CM | POA: Diagnosis not present

## 2023-07-12 DIAGNOSIS — K219 Gastro-esophageal reflux disease without esophagitis: Secondary | ICD-10-CM | POA: Diagnosis not present

## 2023-07-12 DIAGNOSIS — R1900 Intra-abdominal and pelvic swelling, mass and lump, unspecified site: Secondary | ICD-10-CM

## 2023-07-12 DIAGNOSIS — I728 Aneurysm of other specified arteries: Secondary | ICD-10-CM

## 2023-07-12 DIAGNOSIS — E782 Mixed hyperlipidemia: Secondary | ICD-10-CM

## 2023-07-17 ENCOUNTER — Encounter (INDEPENDENT_AMBULATORY_CARE_PROVIDER_SITE_OTHER): Payer: Self-pay | Admitting: Vascular Surgery

## 2023-07-17 DIAGNOSIS — R1900 Intra-abdominal and pelvic swelling, mass and lump, unspecified site: Secondary | ICD-10-CM | POA: Insufficient documentation

## 2023-07-17 NOTE — Progress Notes (Signed)
 MRN : [id number]  Linda Ferguson is a 78 y.o. (13-Sep-1945) female who presents with chief complaint of check circulation.  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 The aneurysm was found incidentally by CT scan dated 05/28/2023.  CT scan was obtained to evaluate left flank pain.  Today the patient denies abdominal pain or unusual back pain, no other abdominal complaints.  No history of an abrupt onset of a painful toe associated with blue discoloration.  Her primary concern is an area of the left flank that protrudes out intermittently and can be quite tender.  It frequently does this when she is standing.  No history of trauma to this area.  No family history of AAA.   Patient denies amaurosis fugax or TIA symptoms. There is no history of claudication or rest pain symptoms of the lower extremities.  The patient denies angina or shortness of breath.  CT scan dated 05/28/2023 shows a 3.3 cm well calcified splenic artery aneurysm.  Also although not noted on the report there does appear to be a mass probably a hernia of the left flank.  Current Meds  Medication Sig   amLODipine (NORVASC) 5 MG tablet TAKE 1 TABLET (5 MG TOTAL) BY MOUTH DAILY.   aspirin EC 81 MG tablet Take 81 mg by mouth daily.   cetirizine (ZYRTEC) 10 MG tablet Take 1 tablet (10 mg total) by mouth daily.   escitalopram (LEXAPRO) 10 MG tablet Take 1 tablet (10 mg total) by mouth daily.   famotidine (PEPCID) 20 MG tablet Take 20 mg by mouth daily.   fluticasone (FLONASE) 50 MCG/ACT nasal spray PLACE 2 SPRAYS INTO BOTH NOSTRILS DAILY.   glucose blood (ONETOUCH VERIO) test strip Use as instructed   losartan (COZAAR) 100 MG tablet Take 100 mg by mouth daily.   lovastatin (MEVACOR) 40 MG tablet Take 1 tablet (40 mg total) by mouth at bedtime.   metFORMIN (GLUCOPHAGE) 500 MG tablet  Take 1 tablet (500 mg total) by mouth daily with breakfast.   OneTouch Delica Lancets 33G MISC 1 each by Does not apply route daily before breakfast.   pantoprazole (PROTONIX) 40 MG tablet Take 40 mg by mouth daily.   rosuvastatin (CRESTOR) 40 MG tablet Take 40 mg by mouth daily.    Past Medical History:  Diagnosis Date   Atrial fibrillation (HCC)    Chest pain    DM (diabetes mellitus) (HCC)    Estrogen deficiency    HTN (hypertension)    Hyperlipidemia    Splenic artery aneurysm (HCC)    Vertigo     History reviewed. No pertinent surgical history.  Social History Social History   Tobacco Use   Smoking status: Never   Smokeless tobacco: Never  Vaping Use   Vaping status: Never Used  Substance Use Topics   Alcohol use: No   Drug use: No    Family History History reviewed. No pertinent family history.  Allergies  Allergen Reactions   Allegra-D Allergy & Congestion [Fexofenadine-Pseudoephed Er] Palpitations  REVIEW OF SYSTEMS (Negative unless checked)  Constitutional: [] Weight loss  [] Fever  [] Chills Cardiac: [] Chest pain   [] Chest pressure   [] Palpitations   [] Shortness of breath when laying flat   [] Shortness of breath with exertion. Vascular:  [x] Pain in legs with walking   [] Pain in legs at rest  [] History of DVT   [] Phlebitis   [] Swelling in legs   [] Varicose veins   [] Non-healing ulcers Pulmonary:   [] Uses home oxygen   [] Productive cough   [] Hemoptysis   [] Wheeze  [] COPD   [] Asthma Neurologic:  [] Dizziness   [] Seizures   [] History of stroke   [] History of TIA  [] Aphasia   [] Vissual changes   [] Weakness or numbness in arm   [] Weakness or numbness in leg Musculoskeletal:   [] Joint swelling   [] Joint pain   [] Low back pain Hematologic:  [] Easy bruising  [] Easy bleeding   [] Hypercoagulable state   [] Anemic Gastrointestinal:  [] Diarrhea   [] Vomiting  [] Gastroesophageal reflux/heartburn   [] Difficulty swallowing. Genitourinary:  [] Chronic kidney disease    [] Difficult urination  [] Frequent urination   [] Blood in urine Skin:  [] Rashes   [] Ulcers  Psychological:  [] History of anxiety   []  History of major depression.  Physical Examination  Vitals:   07/12/23 1032  BP: (!) 157/65  Pulse: (!) 54  Resp: 18  Weight: 155 lb 6.4 oz (70.5 kg)  Height: 5' 4.5" (1.638 m)   Body mass index is 26.26 kg/m. Gen: WD/WN, NAD Head: [place]/AT, No temporalis wasting.  Ear/Nose/Throat: Hearing grossly intact, nares w/o erythema or drainage Eyes: PER, EOMI, sclera nonicteric.  Neck: Supple, no masses.  No bruit or JVD.  Pulmonary:  Good air movement, no audible wheezing, no use of accessory muscles.  Cardiac: RRR, normal S1, S2, no Murmurs. Vascular:   Vessel Right Left  Radial Palpable Palpable  Gastrointestinal: soft, non-distended there is a palpable nodule or mass on the left flank which is very mildly tender to deeper palpation.. No guarding/no peritoneal signs.  Musculoskeletal: M/S 5/5 throughout.  No visible deformity.  Neurologic: CN 2-12 intact. Pain and light touch intact in extremities.  Symmetrical.  Speech is fluent. Motor exam as listed above. Psychiatric: Judgment intact, Mood & affect appropriate for pt's clinical situation. Dermatologic: No rashes or ulcers noted.  No changes consistent with cellulitis.   CBC Lab Results  Component Value Date   WBC 7.7 05/28/2023   HGB 11.9 (L) 05/28/2023   HCT 36.6 05/28/2023   MCV 94.6 05/28/2023   PLT 288 05/28/2023    BMET    Component Value Date/Time   NA 135 05/28/2023 1643   NA 141 12/14/2022 0838   K 4.8 05/28/2023 1643   CL 105 05/28/2023 1643   CO2 18 (L) 05/28/2023 1643   GLUCOSE 85 05/28/2023 1643   BUN 27 (H) 05/28/2023 1643   BUN 14 12/14/2022 0838   CREATININE 0.99 05/28/2023 1643   CALCIUM 8.8 (L) 05/28/2023 1643   GFRNONAA 58 (L) 05/28/2023 1643   GFRAA 93 11/11/2006 1144   CrCl cannot be calculated (Patient's most recent lab result is older than the maximum 21 days  allowed.).  COAG No results found for: "INR", "PROTIME"  Radiology No results found.   Assessment/Plan 1. Splenic artery aneurysm (HCC) (Primary) No surgery or intervention at this time.  The patient has an asymptomatic splenic artery aneurysm that is greater than 2.5 cm in maximal diameter.  However, it is well calcified and is unchanged compared to a scan from 14  years ago.  Given this fact I do not believe that intervention is indicated at this time.  However, as these small aneurysms tend to enlarge over time, continued surveillance with ultrasound or CT scan is mandatory.   I have also discussed optimizing medical management of co-morbidities such as  hypertension and lipid control as well as the importance of abstinence from tobacco if the patient is a current smoker.  The patient is also encouraged to exercise a minimum of 30 minutes 4 times a week.   Should the patient develop new onset abdominal or back pain they are instructed to seek medical attention immediately and to alert the physician providing care that they have an aneurysm.   The patient voices their understanding.  The patient will return in 6 months with an mesenteric  duplex.  - VAS [place] MESENTERIC; Future  2. Abdominal wall mass of left flank Given this is her primary complaint I will ask general surgery to evaluate. - Ambulatory referral to General Surgery  3. Type 2 diabetes mellitus with other specified complication, without long-term current use of insulin (HCC) Continue hypoglycemic medications as already ordered, these medications have been reviewed and there are no changes at this time.  Hgb A1C to be monitored as already arranged by primary service  4. Gastroesophageal reflux disease without esophagitis Continue PPI as already ordered, this medication has been reviewed and there are no changes at this time.  Avoidence of caffeine and alcohol  Moderate elevation of the head of the bed   5. Mixed  hyperlipidemia Continue statin as ordered and reviewed, no changes at this time    Levora Dredge, MD  07/17/2023 3:28 PM

## 2023-07-19 ENCOUNTER — Other Ambulatory Visit (INDEPENDENT_AMBULATORY_CARE_PROVIDER_SITE_OTHER): Payer: Self-pay | Admitting: Nurse Practitioner

## 2023-07-19 ENCOUNTER — Telehealth (INDEPENDENT_AMBULATORY_CARE_PROVIDER_SITE_OTHER): Payer: Self-pay | Admitting: Vascular Surgery

## 2023-07-19 DIAGNOSIS — I728 Aneurysm of other specified arteries: Secondary | ICD-10-CM

## 2023-07-19 NOTE — Telephone Encounter (Signed)
 Per call from patient via telephone spanish interp, patient states they need a referral to to [facility] [place]. Patient lives in [place] and would like to be treated closer to home. Patient is aware is may take a couple of weeks to be contacted from the [place] office. Please advise.

## 2023-08-04 NOTE — Progress Notes (Unsigned)
 Office Note     CC: 3 cm splenic artery aneurysm Requesting Provider:  Georgiana Spinner, NP  HPI: Linda Ferguson is a 78 y.o. (1945/05/18) female presenting at the request of .[name], MD ***  The pt is *** on a statin for cholesterol management.  The pt is *** on a daily aspirin.   Other AC:  *** The pt is *** on medication for hypertension.   The pt is *** diabetic.  Tobacco hx:  ***  Past Medical History:  Diagnosis Date   Atrial fibrillation (HCC)    Chest pain    DM (diabetes mellitus) (HCC)    Estrogen deficiency    HTN (hypertension)    Hyperlipidemia    Splenic artery aneurysm (HCC)    Vertigo     No past surgical history on file.  Social History   Socioeconomic History   Marital status: Married    Spouse name: Not on file   Number of children: Not on file   Years of education: Not on file   Highest education level: Not on file  Occupational History   Not on file  Tobacco Use   Smoking status: Never   Smokeless tobacco: Never  Vaping Use   Vaping status: Never Used  Substance and Sexual Activity   Alcohol use: No   Drug use: No   Sexual activity: Not on file  Other Topics Concern   Not on file  Social History Narrative   Not on file   Social Drivers of Health   Financial Resource Strain: Not on file  Food Insecurity: Not on file  Transportation Needs: Not on file  Physical Activity: Not on file  Stress: Not on file  Social Connections: Not on file  Intimate Partner Violence: Not on file   ***No family history on file.  Current Outpatient Medications  Medication Sig Dispense Refill   amLODipine (NORVASC) 5 MG tablet TAKE 1 TABLET (5 MG TOTAL) BY MOUTH DAILY. 90 tablet 1   aspirin EC 81 MG tablet Take 81 mg by mouth daily.     Blood Glucose Monitoring Suppl (ONETOUCH VERIO) w/Device KIT 1 each by Does not apply route daily before breakfast. (Patient not taking: Reported on 07/12/2023) 1 kit 0   cetirizine (ZYRTEC) 10  MG tablet Take 1 tablet (10 mg total) by mouth daily. 30 tablet 1   escitalopram (LEXAPRO) 10 MG tablet Take 1 tablet (10 mg total) by mouth daily. 90 tablet 1   famotidine (PEPCID) 20 MG tablet Take 20 mg by mouth daily.     fluticasone (FLONASE) 50 MCG/ACT nasal spray PLACE 2 SPRAYS INTO BOTH NOSTRILS DAILY. 48 g 0   glucose blood (ONETOUCH VERIO) test strip Use as instructed 100 each 12   hydrOXYzine (ATARAX) 25 MG tablet TAKE 1 TABLET (25 MG TOTAL) BY MOUTH AT BEDTIME AS NEEDED. (Patient not taking: Reported on 07/12/2023) 90 tablet 0   losartan (COZAAR) 100 MG tablet Take 100 mg by mouth daily.     losartan-hydrochlorothiazide (HYZAAR) 100-25 MG tablet Take 1 tablet by mouth daily. (Patient not taking: Reported on 07/12/2023) 90 tablet 1   lovastatin (MEVACOR) 40 MG tablet Take 1 tablet (40 mg total) by mouth at bedtime. 90 tablet 1   meclizine (ANTIVERT) 25 MG tablet Take 1 tablet (25 mg total) by mouth 3 (three) times daily as needed for dizziness. (Patient not taking: Reported on 07/12/2023) 30 tablet 0   meloxicam (MOBIC) 7.5 MG tablet Take 1 tablet (  7.5 mg total) by mouth daily. (Patient not taking: Reported on 07/12/2023) 10 tablet 0   metFORMIN (GLUCOPHAGE) 500 MG tablet Take 1 tablet (500 mg total) by mouth daily with breakfast. 90 tablet 1   OneTouch Delica Lancets 33G MISC 1 each by Does not apply route daily before breakfast. 30 each 11   pantoprazole (PROTONIX) 40 MG tablet Take 40 mg by mouth daily.     rosuvastatin (CRESTOR) 40 MG tablet Take 40 mg by mouth daily.     No current facility-administered medications for this visit.    Allergies  Allergen Reactions   Allegra-D Allergy & Congestion [Fexofenadine-Pseudoephed Er] Palpitations     REVIEW OF SYSTEMS:  *** [X]  denotes positive finding, [ ]  denotes negative finding Cardiac  Comments:  Chest pain or chest pressure:    Shortness of breath upon exertion:    Short of breath when lying flat:    Irregular heart rhythm:         Vascular    Pain in calf, thigh, or hip brought on by ambulation:    Pain in feet at night that wakes you up from your sleep:     Blood clot in your veins:    Leg swelling:         Pulmonary    Oxygen at home:    Productive cough:     Wheezing:         Neurologic    Sudden weakness in arms or legs:     Sudden numbness in arms or legs:     Sudden onset of difficulty speaking or slurred speech:    Temporary loss of vision in one eye:     Problems with dizziness:         Gastrointestinal    Blood in stool:     Vomited blood:         Genitourinary    Burning when urinating:     Blood in urine:        Psychiatric    Major depression:         Hematologic    Bleeding problems:    Problems with blood clotting too easily:        Skin    Rashes or ulcers:        Constitutional    Fever or chills:      PHYSICAL EXAMINATION:  There were no vitals filed for this visit.  General:  WDWN in NAD; vital signs documented above Gait: Not observed HENT: WNL, normocephalic Pulmonary: normal non-labored breathing , without wheezing Cardiac: {Desc; regular/irreg:14544} HR Abdomen: soft, NT, no masses Skin: {With/Without:20273} rashes Vascular Exam/Pulses:  Right Left  Radial {Exam; arterial pulse strength 0-4:30167} {Exam; arterial pulse strength 0-4:30167}  Ulnar {Exam; arterial pulse strength 0-4:30167} {Exam; arterial pulse strength 0-4:30167}  Femoral {Exam; arterial pulse strength 0-4:30167} {Exam; arterial pulse strength 0-4:30167}  Popliteal {Exam; arterial pulse strength 0-4:30167} {Exam; arterial pulse strength 0-4:30167}  DP {Exam; arterial pulse strength 0-4:30167} {Exam; arterial pulse strength 0-4:30167}  PT {Exam; arterial pulse strength 0-4:30167} {Exam; arterial pulse strength 0-4:30167}   Extremities: {With/Without:20273} ischemic changes, {With/Without:20273} Gangrene , {With/Without:20273} cellulitis; {With/Without:20273} open wounds;  Musculoskeletal:  no muscle wasting or atrophy  Neurologic: A&O X 3;  No focal weakness or paresthesias are detected Psychiatric:  The pt has {Desc; normal/abnormal:11317::"Normal"} affect.   Non-Invasive Vascular Imaging:   ***    ASSESSMENT/PLAN: [name] is a 78 y.o. female presenting with 3 cm splenic artery aneurysm.  This  was appreciated incidentally on the recent scan.  Patient has been previously followed by [facility], and saw Dr. Prescilla Brod last month.  Bleeding artery aneurysm-I agree with this assessment.  On my assessment, the aneurysm measuring right at 3 cm.  Society of vascular surgery guidelines recommend treatment of aneurysms greater than 3 cm, however upon evaluating previous imaging, the size has been unchanged for 14 years.  Imaging also demonstrates that it is circumferentially calcified.  I think that the odds that this increased in size is very low.    ***   Kayla Part, MD [facility] [phone]

## 2023-08-05 ENCOUNTER — Ambulatory Visit (INDEPENDENT_AMBULATORY_CARE_PROVIDER_SITE_OTHER): Payer: Self-pay | Admitting: Vascular Surgery

## 2023-08-05 ENCOUNTER — Encounter: Payer: Self-pay | Admitting: Vascular Surgery

## 2023-08-05 DIAGNOSIS — R1900 Intra-abdominal and pelvic swelling, mass and lump, unspecified site: Secondary | ICD-10-CM

## 2023-09-07 ENCOUNTER — Encounter (INDEPENDENT_AMBULATORY_CARE_PROVIDER_SITE_OTHER): Payer: Self-pay

## 2023-09-28 ENCOUNTER — Telehealth (INDEPENDENT_AMBULATORY_CARE_PROVIDER_SITE_OTHER): Payer: Self-pay

## 2023-09-28 ENCOUNTER — Telehealth: Payer: Self-pay

## 2023-09-28 NOTE — Telephone Encounter (Signed)
 Left VM on triage line at [facility] to let them know pt saw general surgery yesterday and they faxed us  the office note, instead of their office. This should be viewable in 'care everywhere' for them to see.

## 2023-09-28 NOTE — Telephone Encounter (Signed)
 [facility] left a message informing that patient has been seen with general surgery on yesterday and faxed office note to their office instead to our office. Note should be available on care everywhere.

## 2024-01-10 ENCOUNTER — Ambulatory Visit (INDEPENDENT_AMBULATORY_CARE_PROVIDER_SITE_OTHER)

## 2024-01-10 ENCOUNTER — Ambulatory Visit (INDEPENDENT_AMBULATORY_CARE_PROVIDER_SITE_OTHER): Admitting: Vascular Surgery

## 2024-01-10 ENCOUNTER — Encounter (INDEPENDENT_AMBULATORY_CARE_PROVIDER_SITE_OTHER): Payer: Self-pay | Admitting: Vascular Surgery

## 2024-01-10 VITALS — BP 159/74 | HR 49 | Resp 16 | Ht 64.0 in | Wt 146.4 lb

## 2024-01-10 DIAGNOSIS — I728 Aneurysm of other specified arteries: Secondary | ICD-10-CM

## 2024-01-10 DIAGNOSIS — E1169 Type 2 diabetes mellitus with other specified complication: Secondary | ICD-10-CM | POA: Diagnosis not present

## 2024-01-10 DIAGNOSIS — I10 Essential (primary) hypertension: Secondary | ICD-10-CM | POA: Diagnosis not present

## 2024-01-10 DIAGNOSIS — K219 Gastro-esophageal reflux disease without esophagitis: Secondary | ICD-10-CM

## 2024-01-10 DIAGNOSIS — E782 Mixed hyperlipidemia: Secondary | ICD-10-CM

## 2024-01-10 NOTE — Progress Notes (Signed)
 MRN : [id number]  Linda Ferguson is a 78 y.o. (1945-11-08) female who presents with chief complaint of check circulation.  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 The aneurysm was found incidentally by CT scan dated 05/28/2023.  CT scan was obtained to evaluate left flank pain.  Today the patient denies abdominal pain or unusual back pain, no other abdominal complaints.  Her primary concern is an area of the left flank that protrudes out intermittently and can be quite tender.  It frequently does this when she is standing.  No history of trauma to this area.  She was evaluated by a general surgeon and told that there are no hernias present.  Since her last visit she has been informed that she does have a ruptured disc.  In review of the CT scan from February 2025 there are degenerative changes noted in the thoracolumbar spine.   No family history of AAA.    Patient denies amaurosis fugax or TIA symptoms. There is no history of claudication or rest pain symptoms of the lower extremities.  The patient denies angina or shortness of breath.   CT scan dated 05/28/2023 shows a 3.3 cm well calcified splenic artery aneurysm.  Also although not noted on the report there does appear to be a mass probably a hernia of the left flank.  Duplex ultrasound of the mesenteric system obtained today demonstrates normal flow.  There is no evidence for a significant increase in the size of the splenic artery aneurysm  No outpatient medications have been marked as taking for the 01/10/24 encounter (Office Visit) with Jama, Cordella MATSU, MD.    Past Medical History:  Diagnosis Date   Atrial fibrillation (HCC)    Chest pain    DM (diabetes mellitus) (HCC)    Estrogen deficiency    HTN (hypertension)    Hyperlipidemia    Splenic artery aneurysm (HCC)    Vertigo      No past surgical history on file.  Social History Social History   Tobacco Use   Smoking status: Never   Smokeless tobacco: Never  Vaping Use   Vaping status: Never Used  Substance Use Topics   Alcohol use: No   Drug use: No    Family History No family history on file.  Allergies  Allergen Reactions   Allegra-D Allergy & Congestion [Fexofenadine-Pseudoephed Er] Palpitations     REVIEW OF SYSTEMS (Negative unless checked)  Constitutional: [] Weight loss  [] Fever  [] Chills Cardiac: [] Chest pain   [] Chest pressure   [] Palpitations   [] Shortness of breath when laying flat   [] Shortness of breath with exertion. Vascular:  [x] Pain in legs with walking   [] Pain in legs at rest  [] History of DVT   [] Phlebitis   [] Swelling in legs   [] Varicose veins   [] Non-healing ulcers Pulmonary:   [] Uses home oxygen   [] Productive cough   [] Hemoptysis   [] Wheeze  [] COPD   [] Asthma Neurologic:  [] Dizziness   [] Seizures   [] History of stroke   [] History of TIA  []   Aphasia   [] Vissual changes   [] Weakness or numbness in arm   [] Weakness or numbness in leg Musculoskeletal:   [] Joint swelling   [] Joint pain   [] Low back pain Hematologic:  [] Easy bruising  [] Easy bleeding   [] Hypercoagulable state   [] Anemic Gastrointestinal:  [] Diarrhea   [] Vomiting  [] Gastroesophageal reflux/heartburn   [] Difficulty swallowing. Genitourinary:  [] Chronic kidney disease   [] Difficult urination  [] Frequent urination   [] Blood in urine Skin:  [] Rashes   [] Ulcers  Psychological:  [] History of anxiety   []  History of major depression.  Physical Examination  There were no vitals filed for this visit. There is no height or weight on file to calculate BMI. Gen: WD/WN, NAD Head: [place]/AT, No temporalis wasting.  Ear/Nose/Throat: Hearing grossly intact, nares w/o erythema or drainage Eyes: PER, EOMI, sclera nonicteric.  Neck: Supple, no masses.  No bruit or JVD.  Pulmonary:  Good air movement, no audible wheezing, no use  of accessory muscles.  Cardiac: RRR, normal S1, S2, no Murmurs. Vascular:  mild trophic changes, no open wounds Vessel Right Left  Radial Palpable Palpable  PT Not Palpable Not Palpable  DP Not Palpable Not Palpable  Gastrointestinal: soft, non-distended. No guarding/no peritoneal signs.  Musculoskeletal: M/S 5/5 throughout.  No visible deformity.  Neurologic: CN 2-12 intact. Pain and light touch intact in extremities.  Symmetrical.  Speech is fluent. Motor exam as listed above. Psychiatric: Judgment intact, Mood & affect appropriate for pt's clinical situation. Dermatologic: No rashes or ulcers noted.  No changes consistent with cellulitis.   CBC Lab Results  Component Value Date   WBC 7.7 05/28/2023   HGB 11.9 (L) 05/28/2023   HCT 36.6 05/28/2023   MCV 94.6 05/28/2023   PLT 288 05/28/2023    BMET    Component Value Date/Time   NA 135 05/28/2023 1643   NA 141 12/14/2022 0838   K 4.8 05/28/2023 1643   CL 105 05/28/2023 1643   CO2 18 (L) 05/28/2023 1643   GLUCOSE 85 05/28/2023 1643   BUN 27 (H) 05/28/2023 1643   BUN 14 12/14/2022 0838   CREATININE 0.99 05/28/2023 1643   CALCIUM 8.8 (L) 05/28/2023 1643   GFRNONAA 58 (L) 05/28/2023 1643   GFRAA 93 11/11/2006 1144   CrCl cannot be calculated (Patient's most recent lab result is older than the maximum 21 days allowed.).  COAG No results found for: INR, PROTIME  Radiology No results found.   Assessment/Plan 1. Splenic artery aneurysm (HCC) (Primary) No surgery or intervention at this time.   The patient has an asymptomatic splenic artery aneurysm that is greater than 2.5 cm in maximal diameter.  However, it is well calcified and is unchanged compared to a scan from 14 years ago.  Given this fact I do not believe that intervention is indicated at this time.  However, as these small aneurysms tend to enlarge over time, continued surveillance with ultrasound or CT scan is mandatory.    I have also discussed  optimizing medical management of co-morbidities such as  hypertension and lipid control as well as the importance of abstinence from tobacco if the patient is a current smoker.  The patient is also encouraged to exercise a minimum of 30 minutes 4 times a week.    Should the patient develop new onset abdominal or back pain they are instructed to seek medical attention immediately and to alert the physician providing care that they have an aneurysm.    The patient voices their understanding.  The patient will return in 12 months with an mesenteric  duplex.  - VAS US  MESENTERIC; Future  2. Primary hypertension Continue antihypertensive medications as already ordered, these medications have been reviewed and there are no changes at this time.  3. Gastroesophageal reflux disease without esophagitis Continue PPI as already ordered, this medication has been reviewed and there are no changes at this time.  Avoidence of caffeine and alcohol  Moderate elevation of the head of the bed   4. Type 2 diabetes mellitus with other specified complication, without long-term current use of insulin (HCC) Continue hypoglycemic medications as already ordered, these medications have been reviewed and there are no changes at this time.  Hgb A1C to be monitored as already arranged by primary service  5. Mixed hyperlipidemia Continue statin as ordered and reviewed, no changes at this time    Cordella Shawl, MD  01/10/2024 8:36 AM

## 2025-01-11 ENCOUNTER — Encounter (INDEPENDENT_AMBULATORY_CARE_PROVIDER_SITE_OTHER)

## 2025-01-11 ENCOUNTER — Ambulatory Visit (INDEPENDENT_AMBULATORY_CARE_PROVIDER_SITE_OTHER): Admitting: Vascular Surgery
